# Patient Record
Sex: Female | Born: 1950 | Race: White | Hispanic: No | Marital: Married | Smoking: Never smoker
Health system: Southern US, Community
[De-identification: ages and names within clinical notes are randomized; demographics above are authoritative.]

## PROBLEM LIST (undated history)

## (undated) DIAGNOSIS — E7800 Pure hypercholesterolemia, unspecified: Secondary | ICD-10-CM

## (undated) DIAGNOSIS — K219 Gastro-esophageal reflux disease without esophagitis: Secondary | ICD-10-CM

## (undated) DIAGNOSIS — G4730 Sleep apnea, unspecified: Secondary | ICD-10-CM

## (undated) DIAGNOSIS — E119 Type 2 diabetes mellitus without complications: Secondary | ICD-10-CM

## (undated) HISTORY — DX: Pure hypercholesterolemia, unspecified: E78.00

## (undated) HISTORY — DX: Gastro-esophageal reflux disease without esophagitis: K21.9

## (undated) HISTORY — DX: Type 2 diabetes mellitus without complications: E11.9

## (undated) HISTORY — PX: DILATION AND CURETTAGE OF UTERUS: SHX78

## (undated) HISTORY — PX: CARPAL TUNNEL RELEASE: SHX101

## (undated) HISTORY — PX: ROTATOR CUFF REPAIR: SHX139

---

## 2002-01-21 ENCOUNTER — Ambulatory Visit (HOSPITAL_BASED_OUTPATIENT_CLINIC_OR_DEPARTMENT_OTHER): Admission: RE | Admit: 2002-01-21 | Discharge: 2002-01-21 | Payer: Self-pay | Admitting: Orthopedic Surgery

## 2004-03-29 ENCOUNTER — Encounter: Admission: RE | Admit: 2004-03-29 | Discharge: 2004-03-29 | Payer: Self-pay | Admitting: Unknown Physician Specialty

## 2005-04-12 ENCOUNTER — Encounter: Admission: RE | Admit: 2005-04-12 | Discharge: 2005-04-12 | Payer: Self-pay | Admitting: Unknown Physician Specialty

## 2005-07-31 ENCOUNTER — Emergency Department: Payer: Self-pay | Admitting: Unknown Physician Specialty

## 2005-07-31 ENCOUNTER — Other Ambulatory Visit: Payer: Self-pay

## 2005-12-06 ENCOUNTER — Ambulatory Visit: Payer: Self-pay

## 2006-04-03 ENCOUNTER — Ambulatory Visit: Payer: Self-pay | Admitting: Internal Medicine

## 2006-04-17 ENCOUNTER — Ambulatory Visit: Payer: Self-pay | Admitting: Internal Medicine

## 2006-04-17 ENCOUNTER — Encounter: Admission: RE | Admit: 2006-04-17 | Discharge: 2006-04-17 | Payer: Self-pay | Admitting: Unknown Physician Specialty

## 2006-05-15 ENCOUNTER — Ambulatory Visit: Payer: Self-pay | Admitting: Gastroenterology

## 2006-05-28 ENCOUNTER — Ambulatory Visit (HOSPITAL_BASED_OUTPATIENT_CLINIC_OR_DEPARTMENT_OTHER): Admission: RE | Admit: 2006-05-28 | Discharge: 2006-05-28 | Payer: Self-pay | Admitting: Orthopedic Surgery

## 2006-07-08 ENCOUNTER — Ambulatory Visit: Payer: Self-pay | Admitting: Gastroenterology

## 2007-05-25 ENCOUNTER — Encounter: Admission: RE | Admit: 2007-05-25 | Discharge: 2007-05-25 | Payer: Self-pay | Admitting: Unknown Physician Specialty

## 2007-10-06 ENCOUNTER — Ambulatory Visit: Payer: Self-pay

## 2007-10-08 ENCOUNTER — Ambulatory Visit: Payer: Self-pay

## 2008-06-01 ENCOUNTER — Encounter: Admission: RE | Admit: 2008-06-01 | Discharge: 2008-06-01 | Payer: Self-pay | Admitting: Unknown Physician Specialty

## 2009-06-19 ENCOUNTER — Encounter: Admission: RE | Admit: 2009-06-19 | Discharge: 2009-06-19 | Payer: Self-pay | Admitting: Unknown Physician Specialty

## 2010-03-04 ENCOUNTER — Encounter: Payer: Self-pay | Admitting: Unknown Physician Specialty

## 2010-06-07 ENCOUNTER — Other Ambulatory Visit: Payer: Self-pay | Admitting: Unknown Physician Specialty

## 2010-06-07 DIAGNOSIS — Z1231 Encounter for screening mammogram for malignant neoplasm of breast: Secondary | ICD-10-CM

## 2010-06-22 ENCOUNTER — Ambulatory Visit: Payer: Self-pay

## 2010-06-29 NOTE — Op Note (Signed)
   NAME:  Stephanie Freeman, Stephanie Freeman                         ACCOUNT NO.:  [id number]   MEDICAL RECORD NO.:  [id number]                   PATIENT TYPE:  AMB   LOCATION:  DSC                                  FACILITY:  [facility]   PHYSICIAN:  Cindee Salt, M.D.                    DATE OF BIRTH:  Mar 26, 1950   DATE OF PROCEDURE:  01/21/2002  DATE OF DISCHARGE:                                 OPERATIVE REPORT   PREOPERATIVE DIAGNOSIS:  Carpal tunnel syndrome, left hand.   POSTOPERATIVE DIAGNOSIS:  Carpal tunnel syndrome, left hand.   OPERATION:  Decompression of left median nerve.   SURGEON:  Cindee Salt, M.D.   ASSISTANT:  Alfredo Bach, R.N.   ANESTHESIA:  Forearm-based IV regional.   HISTORY:  The patient is a 60 year old female with a history of carpal  tunnel syndrome -- EMG and nerve conductions positive -- which has not  responded to conservative treatment.   DESCRIPTION OF PROCEDURE:  The patient was brought to the operating room  where a forearm-based IV regional anesthetic was carried out without  difficulty.  She was prepped and draped using Betadine scrubbing solution,  left arm free, supine position.  The incision was made longitudinally in the  palm and carried down through subcutaneous tissue; bleeders were  electrocauterized.  Palmar fascia was split, superficial palmar arch  identified, the flexor tendons to the ring and little fingers identified.  To the ulnar side of the median nerve, the carpal retinaculum was incised  with sharp dissection.  A right-angle and Sewall retractor were placed  between skin and forearm fascia.  The fascia was released for approximately  3 cm proximal to the wrist crease under direct vision.  The canal was  explored; the tenosynovial tissue was thickened; no further lesions were  identified.  The wound was irrigated.  The skin was closed with interrupted  5-0 nylon sutures.  A sterile compressive dressing and splint were applied.  The patient tolerated  the procedure well and was taken to the recovery room  for observation in satisfactory condition.   She is discharged home to return to the [facility] in one  week on Vicodin and Keflex.                                               Cindee Salt, M.D.    GK/MEDQ  D:  01/21/2002  T:  01/21/2002  Job:  [id number]

## 2010-06-29 NOTE — Op Note (Signed)
Stephanie Freeman, Stephanie Freeman               ACCOUNT NO.:  [id number]   MEDICAL RECORD NO.:  [id number]          PATIENT TYPE:  AMB   LOCATION:  DSC                          FACILITY:  [facility]   PHYSICIAN:  Mila Homer. Sherlean Foot, M.D. DATE OF BIRTH:  07/07/1950   DATE OF PROCEDURE:  05/28/2006  DATE OF DISCHARGE:                               OPERATIVE REPORT   SURGEON:  Mila Homer. Sherlean Foot, M.D.   ASSISTANT:  None.   ANESTHESIA:  General.   PREOPERATIVE DIAGNOSIS:  Right shoulder impingement syndrome.   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   PROCEDURE:  Right shoulder arthroscopy, subacromial decompression,  distal clavicle resection, mini open rotator cuff repair.   DESCRIPTION OF PROCEDURE:  The patient was laid supine, administered  general anesthesia and placed in the beach chair position.  The right  shoulder was then prepped and draped in the usual sterile fashion.  Anterior, posterior direct lateral portals were created with a #11  blade, blunt trocar and cannula.  Diagnostic arthroscopy at the  glenohumeral joint revealed some partial thickness tearing in the  rotator cuff interval, otherwise, the glenohumeral joint was negative.  I did put a 3.2 gator shaver in the anterior portal and debrided.  It  was a significant partial thickness tear or at least appears from the  glenohumeral joint.  I then went from the posterior portal into the  subacromial space and performed a subacromial decompression with the 4.0  mm cylindrical bur through the direct lateral port.  I also performed a  CA ligament release with the ArthroCare debridement wand.  I did not  feel any AC joint violation was necessary.  After debriding the rotator  cuff after I completed my decompression, I found a full thickness tear  in the area where the rotator cuff interval was and where I had  performed a debridement from the glenohumeral side.  Therefore, I  converted to a  mini open technique, extending the direct lateral portal  up 2 cm and put an Arthrex shoulder retractor in place.  We then  debrided the area to a bleeding bed and placed one 5.5 Bio cork screw  from Arthrex with a modified Mason-Allen suture to repair.  I then  lavaged closed the deltoid interval with a 0 Vicryl interrupted stitch  and the subcuticular with 2-0 Vicryls and then Steri-Strips.  I closed  the portals with 4-0 nylons, dressed with Xeroform dressing sponges and  a 2 inch silk tape and a simple sling.   COMPLICATIONS:  None.   DRAINS:  None.           ______________________________  Mila Homer. Sherlean Foot, M.D.    SDL/MEDQ  D:  05/28/2006  T:  05/28/2006  Job:  [id number]

## 2010-07-02 ENCOUNTER — Ambulatory Visit
Admission: RE | Admit: 2010-07-02 | Discharge: 2010-07-02 | Disposition: A | Discharge status: Home / Self Care | Payer: Commercial Indemnity | Source: Ambulatory Visit | Attending: Unknown Physician Specialty | Admitting: Unknown Physician Specialty

## 2010-07-02 DIAGNOSIS — Z1231 Encounter for screening mammogram for malignant neoplasm of breast: Secondary | ICD-10-CM

## 2010-11-29 ENCOUNTER — Other Ambulatory Visit: Payer: Self-pay | Admitting: Rheumatology

## 2011-06-24 ENCOUNTER — Other Ambulatory Visit: Payer: Self-pay | Admitting: Unknown Physician Specialty

## 2011-06-24 DIAGNOSIS — Z1231 Encounter for screening mammogram for malignant neoplasm of breast: Secondary | ICD-10-CM

## 2011-07-09 ENCOUNTER — Ambulatory Visit: Payer: Commercial Indemnity

## 2011-07-15 ENCOUNTER — Ambulatory Visit
Admission: RE | Admit: 2011-07-15 | Discharge: 2011-07-15 | Disposition: A | Discharge status: Home / Self Care | Payer: Commercial Indemnity | Source: Ambulatory Visit | Attending: Unknown Physician Specialty | Admitting: Unknown Physician Specialty

## 2011-07-15 DIAGNOSIS — Z1231 Encounter for screening mammogram for malignant neoplasm of breast: Secondary | ICD-10-CM

## 2011-11-28 ENCOUNTER — Ambulatory Visit: Payer: Self-pay | Admitting: Gastroenterology

## 2011-12-02 LAB — PATHOLOGY REPORT

## 2012-06-23 ENCOUNTER — Other Ambulatory Visit: Payer: Self-pay

## 2012-06-23 DIAGNOSIS — Z1231 Encounter for screening mammogram for malignant neoplasm of breast: Secondary | ICD-10-CM

## 2012-07-24 ENCOUNTER — Ambulatory Visit: Payer: Commercial Indemnity

## 2012-08-03 ENCOUNTER — Ambulatory Visit
Admission: RE | Admit: 2012-08-03 | Discharge: 2012-08-03 | Disposition: A | Discharge status: Home / Self Care | Payer: Managed Care, Other (non HMO) | Source: Ambulatory Visit

## 2012-08-03 DIAGNOSIS — Z1231 Encounter for screening mammogram for malignant neoplasm of breast: Secondary | ICD-10-CM

## 2013-07-07 ENCOUNTER — Other Ambulatory Visit: Payer: Self-pay

## 2013-07-07 DIAGNOSIS — Z1231 Encounter for screening mammogram for malignant neoplasm of breast: Secondary | ICD-10-CM

## 2013-08-04 ENCOUNTER — Ambulatory Visit: Payer: Managed Care, Other (non HMO)

## 2013-08-18 ENCOUNTER — Ambulatory Visit
Admission: RE | Admit: 2013-08-18 | Discharge: 2013-08-18 | Disposition: A | Discharge status: Home / Self Care | Payer: Managed Care, Other (non HMO) | Source: Ambulatory Visit

## 2013-08-18 ENCOUNTER — Encounter (INDEPENDENT_AMBULATORY_CARE_PROVIDER_SITE_OTHER): Payer: Self-pay

## 2013-08-18 DIAGNOSIS — Z1231 Encounter for screening mammogram for malignant neoplasm of breast: Secondary | ICD-10-CM

## 2014-03-15 DIAGNOSIS — G2581 Restless legs syndrome: Secondary | ICD-10-CM | POA: Insufficient documentation

## 2014-07-07 ENCOUNTER — Encounter: Payer: Managed Care, Other (non HMO) | Attending: Internal Medicine | Admitting: Dietician

## 2014-07-07 ENCOUNTER — Encounter: Payer: Self-pay | Admitting: Dietician

## 2014-07-07 VITALS — Ht 63.25 in | Wt 194.2 lb

## 2014-07-07 DIAGNOSIS — K219 Gastro-esophageal reflux disease without esophagitis: Secondary | ICD-10-CM | POA: Insufficient documentation

## 2014-07-07 DIAGNOSIS — G4733 Obstructive sleep apnea (adult) (pediatric): Secondary | ICD-10-CM | POA: Insufficient documentation

## 2014-07-07 DIAGNOSIS — Z9989 Dependence on other enabling machines and devices: Secondary | ICD-10-CM

## 2014-07-07 DIAGNOSIS — Z86010 Personal history of colonic polyps: Secondary | ICD-10-CM | POA: Insufficient documentation

## 2014-07-07 DIAGNOSIS — E785 Hyperlipidemia, unspecified: Secondary | ICD-10-CM | POA: Insufficient documentation

## 2014-07-07 DIAGNOSIS — R739 Hyperglycemia, unspecified: Secondary | ICD-10-CM | POA: Diagnosis not present

## 2014-07-07 DIAGNOSIS — B9681 Helicobacter pylori [H. pylori] as the cause of diseases classified elsewhere: Secondary | ICD-10-CM | POA: Insufficient documentation

## 2014-07-07 DIAGNOSIS — K2970 Gastritis, unspecified, without bleeding: Secondary | ICD-10-CM

## 2014-07-07 DIAGNOSIS — D690 Allergic purpura: Secondary | ICD-10-CM | POA: Insufficient documentation

## 2014-07-07 NOTE — Progress Notes (Signed)
Medical Nutrition Therapy: Visit start time: 1330  end time: 1430  Assessment:  Diagnosis: hyperglycemia Past medical history: GERD, hyperlipidemia, diverticulosis, hypoglycemia, sleep apnea Psychosocial issues/ stress concerns: none Preferred learning method:  . Hands-on  Current weight: 194.9lbs  Height: 5'3" Medications, supplements: updated list in chart Progress and evaluation: Patient reports recent elevated BG results and is trying to prevent or delay diagnosis of Diabetes.           She reports her husband has Type 2 Diabetes and she has worked with him to make diet changes to reduce carbohydrate intake and increase vegetables and fruits.          Patient reports recent weight loss of 6lbs since making diet changes.  Physical activity: climbs stairs to work, other ADLs  Dietary Intake:  Usual eating pattern includes 2 meals and 0 snacks per day. Dining out frequency: 3-4 meals per week.  Breakfast: [place] yogurt with almonds, granola Snack: usually none, sometimes piece of candy Lunch: salad with avocado, almonds, sm amount dressing Snack: none Supper: 07/06/14 steak sub with tomato/lettuce, peppers onions; chicken and vegetables; hot dog Snack: usually none Beverages: water, coffee, tea sometimes with sugar  Nutrition Care Education: Topics covered: Diabetes prevention Basic nutrition: basic food groups, appropriate nutrient balance, appropriate meal and snack schedule, general nutrition guidelines    Weight control: benefits of weight control; Provided guidance for 1300kcal meal plan (2-3 carbs, 1-3oz protein, vegetables with each meal) Advanced nutrition:  food label reading Diabetes:  goals for BGs, appropriate meal and snack schedule, appropriate carb intake and balance Other lifestyle changes: importance of regular exercise, increasing motivation for exercise.   Nutritional Diagnosis:  NB-2.1 Physical inactivity As related to sedentary job.  As evidenced by elevated BG,  BMI 33.  Intervention: Instruction as noted above.   Patient seems to be making healthy food choices overall with recent diet changes.   Patient declined follow-up visit at this time; encouraged her to call as needed with any questions.     Education Materials given:  . General diet guidelines for Diabetes . Food lists/ Planning A Balanced Meal . Sample meal pattern/ menus: Quick and Healthy Meal Ideas . Goals/ instructions  Learner/ who was taught:  . Patient   Level of understanding: Marland Kitchen Verbalizes/ demonstrates competency  Demonstrated degree of understanding via:   Teach back Learning barriers: . None  Willingness to learn/ readiness for change: . Eager, change in progress  Monitoring and Evaluation:  Patient to call as needed.

## 2014-07-07 NOTE — Patient Instructions (Signed)
Keep carbohydrate choices to 2-3 servings, or 30-45g with each meal.  Begin some exercise on a regular basis; start with short time (i.e. 10 minutes) and increase as you are able.  Continue with healthy food choices.

## 2014-07-13 ENCOUNTER — Other Ambulatory Visit: Payer: Self-pay

## 2014-07-13 DIAGNOSIS — Z1231 Encounter for screening mammogram for malignant neoplasm of breast: Secondary | ICD-10-CM

## 2014-08-22 ENCOUNTER — Ambulatory Visit
Admission: RE | Admit: 2014-08-22 | Discharge: 2014-08-22 | Disposition: A | Discharge status: Home / Self Care | Payer: Managed Care, Other (non HMO) | Source: Ambulatory Visit

## 2014-08-22 DIAGNOSIS — Z1231 Encounter for screening mammogram for malignant neoplasm of breast: Secondary | ICD-10-CM

## 2014-09-21 DIAGNOSIS — E1165 Type 2 diabetes mellitus with hyperglycemia: Secondary | ICD-10-CM | POA: Insufficient documentation

## 2014-09-21 DIAGNOSIS — IMO0002 Reserved for concepts with insufficient information to code with codable children: Secondary | ICD-10-CM | POA: Insufficient documentation

## 2015-03-13 ENCOUNTER — Other Ambulatory Visit: Payer: Self-pay

## 2015-03-13 DIAGNOSIS — Z1231 Encounter for screening mammogram for malignant neoplasm of breast: Secondary | ICD-10-CM

## 2015-04-25 ENCOUNTER — Ambulatory Visit (INDEPENDENT_AMBULATORY_CARE_PROVIDER_SITE_OTHER): Payer: Managed Care, Other (non HMO)

## 2015-04-25 ENCOUNTER — Encounter: Payer: Self-pay | Admitting: Podiatry

## 2015-04-25 ENCOUNTER — Ambulatory Visit (INDEPENDENT_AMBULATORY_CARE_PROVIDER_SITE_OTHER): Payer: Managed Care, Other (non HMO) | Admitting: Podiatry

## 2015-04-25 VITALS — BP 141/78 | HR 76 | Resp 18

## 2015-04-25 DIAGNOSIS — L84 Corns and callosities: Secondary | ICD-10-CM | POA: Diagnosis not present

## 2015-04-25 DIAGNOSIS — M205X9 Other deformities of toe(s) (acquired), unspecified foot: Secondary | ICD-10-CM | POA: Diagnosis not present

## 2015-04-25 DIAGNOSIS — R52 Pain, unspecified: Secondary | ICD-10-CM | POA: Diagnosis not present

## 2015-04-25 DIAGNOSIS — M898X9 Other specified disorders of bone, unspecified site: Secondary | ICD-10-CM | POA: Diagnosis not present

## 2015-04-25 DIAGNOSIS — M205X1 Other deformities of toe(s) (acquired), right foot: Secondary | ICD-10-CM

## 2015-04-25 NOTE — Progress Notes (Signed)
   Subjective:    Patient ID: Stephanie Freeman, female    DOB: [birth year], 65 y.o.   MRN: [id number]  HPI  65 year old female presents the also concerns of pain in the right fourth toe which is been ongoing for possibly 3 months. No recent injury or trauma. No swelling or redness. She also occasionally gets some pain in the big toe joint however is not symptomatic at this time. No other complaints.    Review of Systems  All other systems reviewed and are negative.      Objective:   Physical Exam General: AAO x3, NAD  Dermatological: Hyperkeratotic lesion is present on the lateral aspect of the right fourth toe at the level of the PIPJ. Upon debridement no underlying ulceration, drainage or other signs of infection. No other open lesions or pre-ulcer lesions identified at this time.  Vascular: Dorsalis Pedis artery and Posterior Tibial artery pedal pulses are 2/4 bilateral with immedate capillary fill time. Pedal hair growth present. No varicosities and no lower extremity edema present bilateral. There is no pain with calf compression, swelling, warmth, erythema.   Neruologic: Grossly intact via light touch bilateral. Vibratory intact via tuning fork bilateral. Protective threshold with Semmes Wienstein monofilament intact to all pedal sites bilateral. Patellar and Achilles deep tendon reflexes 2+ bilateral. No Babinski or clonus noted bilateral.   Musculoskeletal: Hallux limitus is present on the right side is a palpable dorsal medial spur of the first metatarsal head. 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 There is mild tenderness along the hyperkeratotic lesion right fourth toe. No other open lesions or pre-ulcerative lesions are identified this time. MMT 5/5.  Gait: Unassisted, Nonantalgic.      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 Hammertoe contractures and I threaded changes in the first MTPJ. No evidence of acute fracture or stress fracture. -Hyperkeratotic lesion was debrided without complications or bleeding. Offloading pads were dispensed. -Discussed shoe gear modifications and orthotics for her foot type however she wishes to on custom orthotics at this time. -Discussed surgical intervention in the future if symptoms persist.  Celesta Gentile, DPM

## 2015-08-24 ENCOUNTER — Other Ambulatory Visit: Payer: Self-pay | Admitting: Obstetrics & Gynecology

## 2015-08-24 ENCOUNTER — Ambulatory Visit
Admission: RE | Admit: 2015-08-24 | Discharge: 2015-08-24 | Disposition: A | Discharge status: Home / Self Care | Payer: Medicare Other | Source: Ambulatory Visit

## 2015-08-24 DIAGNOSIS — Z1231 Encounter for screening mammogram for malignant neoplasm of breast: Secondary | ICD-10-CM

## 2015-12-28 ENCOUNTER — Ambulatory Visit: Payer: Medicare Other | Attending: Specialist

## 2015-12-28 DIAGNOSIS — G4733 Obstructive sleep apnea (adult) (pediatric): Secondary | ICD-10-CM | POA: Insufficient documentation

## 2016-07-10 ENCOUNTER — Other Ambulatory Visit: Payer: Self-pay | Admitting: Internal Medicine

## 2016-07-10 DIAGNOSIS — Z1231 Encounter for screening mammogram for malignant neoplasm of breast: Secondary | ICD-10-CM

## 2016-08-02 ENCOUNTER — Ambulatory Visit (INDEPENDENT_AMBULATORY_CARE_PROVIDER_SITE_OTHER): Payer: Medicare Other | Admitting: Advanced Practice Midwife

## 2016-08-02 ENCOUNTER — Encounter: Payer: Self-pay | Admitting: Advanced Practice Midwife

## 2016-08-02 VITALS — BP 140/60 | HR 74 | Ht 63.0 in | Wt 194.0 lb

## 2016-08-02 DIAGNOSIS — Z124 Encounter for screening for malignant neoplasm of cervix: Secondary | ICD-10-CM | POA: Diagnosis not present

## 2016-08-02 DIAGNOSIS — Z01419 Encounter for gynecological examination (general) (routine) without abnormal findings: Secondary | ICD-10-CM

## 2016-08-02 NOTE — Progress Notes (Signed)
Patient ID: Chimere Klingensmith, female   DOB: 10-Dec-1950, 66 y.o.   MRN: [id number]     Gynecology Annual Exam  PCP: Madelyn Brunner, MD  Chief Complaint:  Chief Complaint  Patient presents with  . Gynecologic Exam    History of Present Illness:Patient is a 66 y.o. G2P1102 presents for annual exam. The patient has no complaints today. She does have a significant family history of breast cancer- her mother diagnosed in her 79's but the patient is undecided on genetic testing at this time.   LMP: No LMP recorded. Patient is postmenopausal. Menarche:not applicable   The patient is not sexually active. She denies dyspareunia.  The patient does perform self breast exams.  There is notable family history of breast or ovarian cancer in her family.  The patient wears seatbelts: yes.   The patient has regular exercise: yes.  The patient admits healthy diet and adequate hydration. She admits good support from friends and family.  The patient denies current symptoms of depression.     Review of Systems: Review of Systems  Constitutional: Negative.   HENT: Negative.   Eyes: Negative.   Respiratory: Negative.   Cardiovascular: Negative.   Gastrointestinal: Negative.   Genitourinary: Negative.   Musculoskeletal: Negative.   Skin: Negative.   Neurological: Negative.   Endo/Heme/Allergies: Negative.   Psychiatric/Behavioral: Negative.     Past Medical History:  Past Medical History:  Diagnosis Date  . Diabetes mellitus without complication ([place])   . GERD (gastroesophageal reflux disease)   . Hypercholesterolemia     Past Surgical History:  Past Surgical History:  Procedure Laterality Date  . CARPAL TUNNEL RELEASE     LEFT  . CESAREAN SECTION     X2  . DILATION AND CURETTAGE OF UTERUS     LAP  . ROTATOR CUFF REPAIR      Gynecologic History:  No LMP recorded. Patient is postmenopausal. Last Pap: Results were: normal 1 year ago  Last mammogram: 1 year ago Results were:  normal Obstetric History: [id number]  Family History:  Family History  Problem Relation Age of Onset  . Breast cancer Mother        60  . Cancer Maternal Aunt        COLON  . Cancer Paternal Uncle        BRAIN  . Cancer Maternal Grandmother        STOMACH  . Dementia Maternal Aunt   . Dementia Maternal Aunt     Social History:  Social History   Social History  . Marital status: Married    Spouse name: N/A  . Number of children: N/A  . Years of education: N/A   Occupational History  . Not on file.   Social History Main Topics  . Smoking status: Never Smoker  . Smokeless tobacco: Never Used  . Alcohol use No  . Drug use: No  . Sexual activity: No   Other Topics Concern  . Not on file   Social History Narrative  . No narrative on file    Allergies:  Allergies  Allergen Reactions  . Kiwi Extract Anaphylaxis    Medications: Prior to Admission medications   Medication Sig Start Date End Date Taking? Authorizing Provider  atorvastatin (LIPITOR) 10 MG tablet Take by mouth. 05/21/16  Yes [provider]  B Complex Vitamins (B-COMPLEX/B-12) TABS Take by mouth.   Yes [provider]  Blood Glucose Monitoring Suppl (RA BLOOD GLUCOSE MONITOR) DEVI  12/28/14  Yes [provider]  Calcium-Magnesium-Zinc 500-250-12.5 MG TABS Take 2 tablets by mouth 1 day or 1 dose.   Yes [provider]  Cholecalciferol (VITAMIN D3) 5000 units CAPS Take 1 capsule by mouth daily.   Yes [provider]  Omega 3-6-9 CAPS Take by mouth.   Yes [provider]  omeprazole (PRILOSEC) 40 MG capsule  05/20/14  Yes [provider]  rOPINIRole (REQUIP) 1 MG tablet  06/13/14  Yes [provider]  Omega 3-6-9 CAPS Take by mouth.    [provider]    Physical Exam Vitals: Blood pressure 140/60, pulse 74, height 5\' 3"  (1.6 m), weight 194 lb (88 kg).  General: NAD HEENT: normocephalic, anicteric Thyroid: no enlargement, no  palpable nodules Pulmonary: No increased work of breathing, CTAB Cardiovascular: RRR, distal pulses 2+ Breast: Breast symmetrical, no tenderness, no palpable nodules or masses, no skin or nipple retraction present, no nipple discharge.  No axillary or supraclavicular lymphadenopathy. Abdomen: NABS, soft, non-tender, non-distended.  Umbilicus without lesions.  No hepatomegaly, splenomegaly or masses palpable. No evidence of hernia  Genitourinary:  External: Normal external female genitalia.  Normal urethral meatus, normal  Bartholin's and Skene's glands.    Vagina: Normal vaginal mucosa, no evidence of prolapse.    Cervix: Grossly normal in appearance, no bleeding, no CMT  Uterus: Non-enlarged, mobile, normal contour.    Adnexa: ovaries non-enlarged, no adnexal masses  Rectal: deferred  Lymphatic: no evidence of inguinal lymphadenopathy Extremities: no edema, erythema, or tenderness Neurologic: Grossly intact Psychiatric: mood appropriate, affect full    Assessment: 66 y.o. [id number] Well woman exam with PAP  Plan: Problem List Items Addressed This Visit    None    Visit Diagnoses    Well woman exam with routine gynecological exam    -  Primary   Relevant Orders   Pap IG (Image Guided)   Cervical cancer screening       Relevant Orders   Pap IG (Image Guided)      1) Mammogram - recommend yearly screening mammogram.  Mammogram scheduled for next month  2) Continue healthy lifestyle diet and exercise  3) ASCCP guidelines and rational discussed.  Patient opts for yearly screening interval  4) Osteoporosis  - per USPTF routine screening DEXA at age 42 Up to date per PCP  5) Routine healthcare maintenance including cholesterol, diabetes screening discussed managed by PCP  6) Colonoscopy up to date per PCP  7) Follow up 1 year for routine annual  Rod Can, [place]

## 2016-08-05 LAB — PAP IG (IMAGE GUIDED): PAP Smear Comment: 0

## 2016-09-02 ENCOUNTER — Ambulatory Visit
Admission: RE | Admit: 2016-09-02 | Discharge: 2016-09-02 | Disposition: A | Discharge status: Home / Self Care | Payer: Medicare Other | Source: Ambulatory Visit | Attending: Internal Medicine | Admitting: Internal Medicine

## 2016-09-02 ENCOUNTER — Other Ambulatory Visit: Payer: Self-pay | Admitting: Advanced Practice Midwife

## 2016-09-02 DIAGNOSIS — Z1231 Encounter for screening mammogram for malignant neoplasm of breast: Secondary | ICD-10-CM

## 2016-09-19 ENCOUNTER — Encounter: Payer: Self-pay | Admitting: Advanced Practice Midwife

## 2017-07-03 ENCOUNTER — Other Ambulatory Visit: Payer: Self-pay | Admitting: Internal Medicine

## 2017-07-03 DIAGNOSIS — Z1231 Encounter for screening mammogram for malignant neoplasm of breast: Secondary | ICD-10-CM

## 2017-08-01 ENCOUNTER — Ambulatory Visit: Admit: 2017-08-01 | Payer: Medicare Other | Admitting: Unknown Physician Specialty

## 2017-08-01 SURGERY — COLONOSCOPY WITH PROPOFOL
Anesthesia: General

## 2017-09-03 ENCOUNTER — Ambulatory Visit
Admission: RE | Admit: 2017-09-03 | Discharge: 2017-09-03 | Disposition: A | Discharge status: Home / Self Care | Payer: Medicare Other | Source: Ambulatory Visit | Attending: Internal Medicine | Admitting: Internal Medicine

## 2017-09-03 DIAGNOSIS — Z1231 Encounter for screening mammogram for malignant neoplasm of breast: Secondary | ICD-10-CM

## 2017-11-05 ENCOUNTER — Encounter: Payer: Self-pay | Admitting: *Deleted

## 2017-11-05 ENCOUNTER — Ambulatory Visit: Payer: Medicare Other | Admitting: Certified Registered Nurse Anesthetist

## 2017-11-05 ENCOUNTER — Encounter
Admission: RE | Disposition: A | Discharge status: Home / Self Care | Payer: Self-pay | Source: Ambulatory Visit | Attending: Unknown Physician Specialty

## 2017-11-05 ENCOUNTER — Ambulatory Visit
Admission: RE | Admit: 2017-11-05 | Discharge: 2017-11-05 | Disposition: A | Discharge status: Home / Self Care | Payer: Medicare Other | Source: Ambulatory Visit | Attending: Unknown Physician Specialty | Admitting: Unknown Physician Specialty

## 2017-11-05 DIAGNOSIS — Z91018 Allergy to other foods: Secondary | ICD-10-CM | POA: Diagnosis not present

## 2017-11-05 DIAGNOSIS — Z79899 Other long term (current) drug therapy: Secondary | ICD-10-CM | POA: Diagnosis not present

## 2017-11-05 DIAGNOSIS — Z803 Family history of malignant neoplasm of breast: Secondary | ICD-10-CM | POA: Diagnosis not present

## 2017-11-05 DIAGNOSIS — E7800 Pure hypercholesterolemia, unspecified: Secondary | ICD-10-CM | POA: Diagnosis not present

## 2017-11-05 DIAGNOSIS — Z800 Family history of malignant neoplasm of digestive organs: Secondary | ICD-10-CM | POA: Diagnosis not present

## 2017-11-05 DIAGNOSIS — D123 Benign neoplasm of transverse colon: Secondary | ICD-10-CM | POA: Insufficient documentation

## 2017-11-05 DIAGNOSIS — Z820 Family history of epilepsy and other diseases of the nervous system: Secondary | ICD-10-CM | POA: Insufficient documentation

## 2017-11-05 DIAGNOSIS — K5730 Diverticulosis of large intestine without perforation or abscess without bleeding: Secondary | ICD-10-CM | POA: Diagnosis not present

## 2017-11-05 DIAGNOSIS — Z86010 Personal history of colonic polyps: Secondary | ICD-10-CM | POA: Diagnosis not present

## 2017-11-05 DIAGNOSIS — E119 Type 2 diabetes mellitus without complications: Secondary | ICD-10-CM | POA: Diagnosis not present

## 2017-11-05 DIAGNOSIS — G4730 Sleep apnea, unspecified: Secondary | ICD-10-CM | POA: Insufficient documentation

## 2017-11-05 DIAGNOSIS — Z7984 Long term (current) use of oral hypoglycemic drugs: Secondary | ICD-10-CM | POA: Diagnosis not present

## 2017-11-05 DIAGNOSIS — Z1211 Encounter for screening for malignant neoplasm of colon: Secondary | ICD-10-CM | POA: Diagnosis present

## 2017-11-05 DIAGNOSIS — K640 First degree hemorrhoids: Secondary | ICD-10-CM | POA: Insufficient documentation

## 2017-11-05 DIAGNOSIS — K219 Gastro-esophageal reflux disease without esophagitis: Secondary | ICD-10-CM | POA: Diagnosis not present

## 2017-11-05 HISTORY — PX: COLONOSCOPY WITH PROPOFOL: SHX5780

## 2017-11-05 HISTORY — DX: Sleep apnea, unspecified: G47.30

## 2017-11-05 LAB — GLUCOSE, CAPILLARY: Glucose-Capillary: 144 mg/dL — ABNORMAL HIGH (ref 70–99)

## 2017-11-05 SURGERY — COLONOSCOPY WITH PROPOFOL
Anesthesia: General

## 2017-11-05 MED ORDER — PROPOFOL 10 MG/ML IV BOLUS
INTRAVENOUS | Status: DC | PRN
  Administered 2017-11-05: 40 mg via INTRAVENOUS
  Administered 2017-11-05: 60 mg via INTRAVENOUS
  Administered 2017-11-05: 30 mg via INTRAVENOUS

## 2017-11-05 MED ORDER — SODIUM CHLORIDE 0.9 % IV SOLN: INTRAVENOUS | Status: DC

## 2017-11-05 MED ORDER — PROPOFOL 500 MG/50ML IV EMUL
INTRAVENOUS | Status: AC
  Filled 2017-11-05: qty 50

## 2017-11-05 MED ORDER — LIDOCAINE HCL (CARDIAC) PF 100 MG/5ML IV SOSY
PREFILLED_SYRINGE | INTRAVENOUS | Status: DC | PRN
  Administered 2017-11-05: 50 mg via INTRAVENOUS

## 2017-11-05 MED ORDER — LIDOCAINE HCL (PF) 2 % IJ SOLN
INTRAMUSCULAR | Status: AC
  Filled 2017-11-05: qty 10

## 2017-11-05 MED ORDER — SODIUM CHLORIDE 0.9 % IV SOLN
INTRAVENOUS | Status: DC
  Administered 2017-11-05: 1000 mL via INTRAVENOUS

## 2017-11-05 MED ORDER — PROPOFOL 500 MG/50ML IV EMUL
INTRAVENOUS | Status: DC | PRN
  Administered 2017-11-05: 175 ug/kg/min via INTRAVENOUS

## 2017-11-05 NOTE — Op Note (Signed)
[facility] Patient Name: Stephanie Freeman Procedure Date: 11/05/2017 7:24 AM MRN: [id number] Account #: [id number] Date of Birth: [birth year] Admit Type: Outpatient Age: 67 Room: [facility] ENDO ROOM 3 Gender: Female Note Status: Finalized Procedure:            Colonoscopy Indications:          High risk colon cancer surveillance: Personal history                        of colonic polyps Providers:            Manya Silvas, MD Referring MD:         Biagio Borg, MD (Referring MD) Medicines:            Propofol per Anesthesia Complications:        No immediate complications. Procedure:            Pre-Anesthesia Assessment:                       - After reviewing the risks and benefits, the patient                        was deemed in satisfactory condition to undergo the                        procedure.                       After obtaining informed consent, the colonoscope was                        passed under direct vision. Throughout the procedure,                        the patient's blood pressure, pulse, and oxygen                        saturations were monitored continuously. The                        Colonoscope was introduced through the anus and                        advanced to the the cecum, identified by appendiceal                        orifice and ileocecal valve. The colonoscopy was                        performed without difficulty. The patient tolerated the                        procedure well. The quality of the bowel preparation                        was excellent. Findings:      A small polyp was found in the transverse colon. The polyp was sessile.       The polyp was removed with a cold snare. Resection and retrieval were       complete.      Many small-mouthed diverticula were found  in the sigmoid colon and       descending colon.      Internal hemorrhoids were found during endoscopy. The hemorrhoids were   small and Grade I (internal hemorrhoids that do not prolapse).      The exam was otherwise without abnormality. Impression:           - One small polyp in the transverse colon, removed with                        a cold snare. Resected and retrieved.                       - Diverticulosis in the sigmoid colon and in the                        descending colon.                       - Internal hemorrhoids.                       - The examination was otherwise normal. Recommendation:       - Await pathology results. Manya Silvas, MD 11/05/2017 8:03:20 AM This report has been signed electronically. Number of Addenda: 0 Note Initiated On: 11/05/2017 7:24 AM Scope Withdrawal Time: 0 hours 11 minutes 18 seconds       [facility]

## 2017-11-05 NOTE — Anesthesia Post-op Follow-up Note (Signed)
Anesthesia QCDR form completed.        

## 2017-11-05 NOTE — Transfer of Care (Signed)
Immediate Anesthesia Transfer of Care Note  Patient: Rishika Mccollom  Procedure(s) Performed: COLONOSCOPY WITH PROPOFOL (N/A )  Patient Location: PACU  Anesthesia Type:General  Level of Consciousness: sedated  Airway & Oxygen Therapy: Patient Spontanous Breathing and Patient connected to nasal cannula oxygen  Post-op Assessment: Report given to RN and Post -op Vital signs reviewed and stable  Post vital signs: Reviewed and stable  Last Vitals:  Vitals Value Taken Time  BP 87/35 11/05/2017  8:02 AM  Temp 35.9 C 11/05/2017  8:00 AM  Pulse 66 11/05/2017  8:03 AM  Resp 16 11/05/2017  8:03 AM  SpO2 99 % 11/05/2017  8:03 AM  Vitals shown include unvalidated device data.  Last Pain:  Vitals:   11/05/17 0800  TempSrc: Tympanic  PainSc:          Complications: No apparent anesthesia complications

## 2017-11-05 NOTE — H&P (Signed)
Primary Care Physician:  Baxter Hire, MD Primary Gastroenterologist:  Dr. Vira Agar  Pre-Procedure History & Physical: HPI:  Stephanie Freeman is a 67 y.o. female is here for an colonoscopy.  Done for [facility] colon polyps.   Past Medical History:  Diagnosis Date  . Diabetes mellitus without complication ([place])   . GERD (gastroesophageal reflux disease)   . Hypercholesterolemia   . Sleep apnea     Past Surgical History:  Procedure Laterality Date  . CARPAL TUNNEL RELEASE     LEFT  . CESAREAN SECTION     X2  . DILATION AND CURETTAGE OF UTERUS     LAP  . ROTATOR CUFF REPAIR      Prior to Admission medications   Medication Sig Start Date End Date Taking? Authorizing Provider  glipiZIDE (GLUCOTROL) 5 MG tablet Take by mouth daily before breakfast.   Yes [provider]  metFORMIN (GLUCOPHAGE) 500 MG tablet Take by mouth 2 (two) times daily with a meal.   Yes [provider]  atorvastatin (LIPITOR) 10 MG tablet Take by mouth. 05/21/16   [provider]  B Complex Vitamins (B-COMPLEX/B-12) TABS Take by mouth.    [provider]  Blood Glucose Monitoring Suppl (RA BLOOD GLUCOSE MONITOR) DEVI  12/28/14   [provider]  Calcium-Magnesium-Zinc 500-250-12.5 MG TABS Take 2 tablets by mouth 1 day or 1 dose.    [provider]  Cholecalciferol (VITAMIN D3) 5000 units CAPS Take 1 capsule by mouth daily.    [provider]  Omega 3-6-9 CAPS Take by mouth.    [provider]  Omega 3-6-9 CAPS Take by mouth.    [provider]  omeprazole (PRILOSEC) 40 MG capsule  05/20/14   [provider]  rOPINIRole (REQUIP) 1 MG tablet  06/13/14   [provider]    Allergies as of 08/28/2017 - Review Complete 08/02/2016  Allergen Reaction Noted  . Kiwi extract Anaphylaxis 07/07/2014    Family History  Problem Relation Age of Onset  . Breast cancer Mother        17  . Cancer Maternal Aunt        COLON  .  Cancer Paternal Uncle        BRAIN  . Cancer Maternal Grandmother        STOMACH  . Dementia Maternal Aunt   . Dementia Maternal Aunt     Social History   Socioeconomic History  . Marital status: Married    Spouse name: Not on file  . Number of children: Not on file  . Years of education: Not on file  . Highest education level: Not on file  Occupational History  . Not on file  Social Needs  . Financial resource strain: Not on file  . Food insecurity:    Worry: Not on file    Inability: Not on file  . Transportation needs:    Medical: Not on file    Non-medical: Not on file  Tobacco Use  . Smoking status: Never Smoker  . Smokeless tobacco: Never Used  Substance and Sexual Activity  . Alcohol use: No    Alcohol/week: 0.0 standard drinks  . Drug use: No  . Sexual activity: Never    Birth control/protection: Post-menopausal  Lifestyle  . Physical activity:    Days per week: Not on file    Minutes per session: Not on file  . Stress: Not on file  Relationships  . Social connections:  Talks on phone: Not on file    Gets together: Not on file    Attends religious service: Not on file    Active member of club or organization: Not on file    Attends meetings of clubs or organizations: Not on file    Relationship status: Not on file  . Intimate partner violence:    Fear of current or ex partner: Not on file    Emotionally abused: Not on file    Physically abused: Not on file    Forced sexual activity: Not on file  Other Topics Concern  . Not on file  Social History Narrative  . Not on file    Review of Systems: See HPI, otherwise negative ROS  Physical Exam: BP (!) 144/74   Pulse 63   Temp 98.2 F (36.8 C) (Tympanic)   Resp 18   Ht 5\' 3"  (1.6 m)   Wt 84.8 kg   SpO2 98%   BMI 33.13 kg/m  General:   Alert,  pleasant and cooperative in NAD Head:  Normocephalic and atraumatic. Neck:  Supple; no masses or thyromegaly. Lungs:  Clear throughout to  auscultation.    Heart:  Regular rate and rhythm. Abdomen:  Soft, nontender and nondistended. Normal bowel sounds, without guarding, and without rebound.   Neurologic:  Alert and  oriented x4;  grossly normal neurologically.  Impression/Plan: Stephanie Freeman is here for an colonoscopy to be performed for Personal history of colon polyps.  Risks, benefits, limitations, and alternatives regarding  colonoscopy have been reviewed with the patient.  Questions have been answered.  All parties agreeable.   Gaylyn Cheers, MD  11/05/2017, 7:30 AM

## 2017-11-05 NOTE — Anesthesia Procedure Notes (Signed)
Date/Time: 11/05/2017 7:35 AM Performed by: Johnna Acosta, CRNA Pre-anesthesia Checklist: Patient identified, Emergency Drugs available, Suction available, Patient being monitored and Timeout performed Patient Re-evaluated:Patient Re-evaluated prior to induction Oxygen Delivery Method: Nasal cannula Preoxygenation: Pre-oxygenation with 100% oxygen Induction Type: IV induction

## 2017-11-05 NOTE — Anesthesia Preprocedure Evaluation (Signed)
Anesthesia Evaluation  Patient identified by MRN, date of birth, ID band Patient awake    Reviewed: Allergy & Precautions, H&P , NPO status , Patient's Chart, lab work & pertinent test results, reviewed documented beta blocker date and time   Airway Mallampati: II  TM Distance: >3 FB Neck ROM: full    Dental  (+) Dental Advidsory Given, Caps, Missing   Pulmonary neg shortness of breath, sleep apnea , neg COPD, neg recent URI,           Cardiovascular Exercise Tolerance: Good negative cardio ROS       Neuro/Psych negative neurological ROS  negative psych ROS   GI/Hepatic Neg liver ROS, GERD  ,  Endo/Other  diabetes, Well Controlled, Oral Hypoglycemic Agents  Renal/GU negative Renal ROS  negative genitourinary   Musculoskeletal   Abdominal   Peds  Hematology negative hematology ROS (+)   Anesthesia Other Findings Past Medical History: No date: Diabetes mellitus without complication (HCC) No date: GERD (gastroesophageal reflux disease) No date: Hypercholesterolemia No date: Sleep apnea   Reproductive/Obstetrics negative OB ROS                             Anesthesia Physical Anesthesia Plan  ASA: II  Anesthesia Plan: General   Post-op Pain Management:    Induction: Intravenous  PONV Risk Score and Plan: 3 and Propofol infusion and TIVA  Airway Management Planned: Nasal Cannula and Natural Airway  Additional Equipment:   Intra-op Plan:   Post-operative Plan:   Informed Consent: I have reviewed the patients History and Physical, chart, labs and discussed the procedure including the risks, benefits and alternatives for the proposed anesthesia with the patient or authorized representative who has indicated his/her understanding and acceptance.   Dental Advisory Given  Plan Discussed with: Anesthesiologist, CRNA and Surgeon  Anesthesia Plan Comments:         Anesthesia  Quick Evaluation

## 2017-11-06 ENCOUNTER — Encounter: Payer: Self-pay | Admitting: Unknown Physician Specialty

## 2017-11-06 LAB — SURGICAL PATHOLOGY

## 2017-11-06 NOTE — Anesthesia Postprocedure Evaluation (Signed)
Anesthesia Post Note  Patient: Stephanie Freeman  Procedure(s) Performed: COLONOSCOPY WITH PROPOFOL (N/A )  Patient location during evaluation: Endoscopy Anesthesia Type: General Level of consciousness: awake and alert Pain management: pain level controlled Vital Signs Assessment: post-procedure vital signs reviewed and stable Respiratory status: spontaneous breathing, nonlabored ventilation, respiratory function stable and patient connected to nasal cannula oxygen Cardiovascular status: blood pressure returned to baseline and stable Postop Assessment: no apparent nausea or vomiting Anesthetic complications: no     Last Vitals:  Vitals:   11/05/17 0820 11/05/17 0830  BP: (!) 112/58 121/66  Pulse: 61 63  Resp: (!) 22 18  Temp:    SpO2: 97% 98%    Last Pain:  Vitals:   11/05/17 0800  TempSrc: Tympanic  PainSc:                  Martha Clan

## 2017-11-27 ENCOUNTER — Other Ambulatory Visit: Payer: Self-pay | Admitting: Internal Medicine

## 2017-11-27 DIAGNOSIS — R945 Abnormal results of liver function studies: Principal | ICD-10-CM

## 2017-11-27 DIAGNOSIS — Z0000 Encounter for general adult medical examination without abnormal findings: Secondary | ICD-10-CM

## 2017-11-27 DIAGNOSIS — R7989 Other specified abnormal findings of blood chemistry: Secondary | ICD-10-CM

## 2017-12-03 ENCOUNTER — Ambulatory Visit
Admission: RE | Admit: 2017-12-03 | Discharge: 2017-12-03 | Disposition: A | Discharge status: Home / Self Care | Payer: Medicare Other | Source: Ambulatory Visit | Attending: Internal Medicine | Admitting: Internal Medicine

## 2017-12-03 DIAGNOSIS — R7989 Other specified abnormal findings of blood chemistry: Secondary | ICD-10-CM

## 2017-12-03 DIAGNOSIS — Z0000 Encounter for general adult medical examination without abnormal findings: Secondary | ICD-10-CM

## 2017-12-03 DIAGNOSIS — R945 Abnormal results of liver function studies: Principal | ICD-10-CM

## 2017-12-11 ENCOUNTER — Other Ambulatory Visit (HOSPITAL_COMMUNITY)
Admission: RE | Admit: 2017-12-11 | Discharge: 2017-12-11 | Disposition: A | Discharge status: Home / Self Care | Payer: Medicare Other | Source: Ambulatory Visit | Attending: Advanced Practice Midwife | Admitting: Advanced Practice Midwife

## 2017-12-11 ENCOUNTER — Ambulatory Visit (INDEPENDENT_AMBULATORY_CARE_PROVIDER_SITE_OTHER): Payer: Medicare Other | Admitting: Advanced Practice Midwife

## 2017-12-11 ENCOUNTER — Encounter: Payer: Self-pay | Admitting: Advanced Practice Midwife

## 2017-12-11 ENCOUNTER — Ambulatory Visit
Admission: RE | Admit: 2017-12-11 | Discharge: 2017-12-11 | Disposition: A | Discharge status: Home / Self Care | Payer: Medicare Other | Source: Ambulatory Visit | Attending: Internal Medicine | Admitting: Internal Medicine

## 2017-12-11 VITALS — BP 124/54 | HR 82 | Ht 63.0 in | Wt 198.0 lb

## 2017-12-11 DIAGNOSIS — R945 Abnormal results of liver function studies: Secondary | ICD-10-CM | POA: Insufficient documentation

## 2017-12-11 DIAGNOSIS — Z0000 Encounter for general adult medical examination without abnormal findings: Secondary | ICD-10-CM | POA: Diagnosis not present

## 2017-12-11 DIAGNOSIS — Z124 Encounter for screening for malignant neoplasm of cervix: Secondary | ICD-10-CM | POA: Diagnosis not present

## 2017-12-11 DIAGNOSIS — K760 Fatty (change of) liver, not elsewhere classified: Secondary | ICD-10-CM | POA: Insufficient documentation

## 2017-12-11 DIAGNOSIS — Z01419 Encounter for gynecological examination (general) (routine) without abnormal findings: Secondary | ICD-10-CM

## 2017-12-11 NOTE — Patient Instructions (Signed)
Preventive Care 65 Years and Older, Female Preventive care refers to lifestyle choices and visits with your health care provider that can promote health and wellness. What does preventive care include?  A yearly physical exam. This is also called an annual well check.  Dental exams once or twice a year.  Routine eye exams. Ask your health care provider how often you should have your eyes checked.  Personal lifestyle choices, including: ? Daily care of your teeth and gums. ? Regular physical activity. ? Eating a healthy diet. ? Avoiding tobacco and drug use. ? Limiting alcohol use. ? Practicing safe sex. ? Taking low-dose aspirin every day. ? Taking vitamin and mineral supplements as recommended by your health care provider. What happens during an annual well check? The services and screenings done by your health care provider during your annual well check will depend on your age, overall health, lifestyle risk factors, and family history of disease. Counseling Your health care provider may ask you questions about your:  Alcohol use.  Tobacco use.  Drug use.  Emotional well-being.  Home and relationship well-being.  Sexual activity.  Eating habits.  History of falls.  Memory and ability to understand (cognition).  Work and work environment.  Reproductive health.  Screening You may have the following tests or measurements:  Height, weight, and BMI.  Blood pressure.  Lipid and cholesterol levels. These may be checked every 5 years, or more frequently if you are over 50 years old.  Skin check.  Lung cancer screening. You may have this screening every year starting at age 55 if you have a 30-pack-year history of smoking and currently smoke or have quit within the past 15 years.  Fecal occult blood test (FOBT) of the stool. You may have this test every year starting at age 50.  Flexible sigmoidoscopy or colonoscopy. You may have a sigmoidoscopy every 5 years or  a colonoscopy every 10 years starting at age 50.  Hepatitis C blood test.  Hepatitis B blood test.  Sexually transmitted disease (STD) testing.  Diabetes screening. This is done by checking your blood sugar (glucose) after you have not eaten for a while (fasting). You may have this done every 1-3 years.  Bone density scan. This is done to screen for osteoporosis. You may have this done starting at age 65.  Mammogram. This may be done every 1-2 years. Talk to your health care provider about how often you should have regular mammograms.  Talk with your health care provider about your test results, treatment options, and if necessary, the need for more tests. Vaccines Your health care provider may recommend certain vaccines, such as:  Influenza vaccine. This is recommended every year.  Tetanus, diphtheria, and acellular pertussis (Tdap, Td) vaccine. You may need a Td booster every 10 years.  Varicella vaccine. You may need this if you have not been vaccinated.  Zoster vaccine. You may need this after age 60.  Measles, mumps, and rubella (MMR) vaccine. You may need at least one dose of MMR if you were born in 1957 or later. You may also need a second dose.  Pneumococcal 13-valent conjugate (PCV13) vaccine. One dose is recommended after age 65.  Pneumococcal polysaccharide (PPSV23) vaccine. One dose is recommended after age 65.  Meningococcal vaccine. You may need this if you have certain conditions.  Hepatitis A vaccine. You may need this if you have certain conditions or if you travel or work in places where you may be exposed to hepatitis   A.  Hepatitis B vaccine. You may need this if you have certain conditions or if you travel or work in places where you may be exposed to hepatitis B.  Haemophilus influenzae type b (Hib) vaccine. You may need this if you have certain conditions.  Talk to your health care provider about which screenings and vaccines you need and how often you  need them. This information is not intended to replace advice given to you by your health care provider. Make sure you discuss any questions you have with your health care provider. Document Released: 02/24/2015 Document Revised: 10/18/2015 Document Reviewed: 11/29/2014 Elsevier Interactive Patient Education  2018 Elsevier Inc.  

## 2017-12-11 NOTE — Progress Notes (Signed)
Patient ID: Stephanie Freeman, female   DOB: 20-Jan-1951, 67 y.o.   MRN: [id number]      Gynecology Annual Exam  PCP: Baxter Hire, MD  Chief Complaint:  Chief Complaint  Patient presents with  . Gynecologic Exam    History of Present Illness:Patient is a 67 y.o. G2P1102 presents for annual exam. The patient has no complaints today.   LMP: No LMP recorded. Patient is postmenopausal. Menarche:not applicable  Postcoital Bleeding: not applicable Dysmenorrhea: not applicable  The patient is not sexually active. She denies dyspareunia.  The patient does perform self breast exams.  There is notable family history of breast or ovarian cancer in her family. Her mother was diagnosed with breast cancer in her 64's. The patient has declined screening.   The patient wears seatbelts: yes.   The patient has regular exercise: she admits being active in the home taking care of her husband.    The patient denies current symptoms of depression.     Review of Systems: Review of Systems  Constitutional: Negative.   HENT: Negative.   Eyes: Negative.   Respiratory: Negative.   Cardiovascular: Negative.   Gastrointestinal:       Diarrhea   Genitourinary: Negative.   Musculoskeletal:       Joint pain   Skin: Negative.   Neurological:       Headaches   Endo/Heme/Allergies: Negative.   Psychiatric/Behavioral: Negative.     Past Medical History:  Past Medical History:  Diagnosis Date  . Diabetes mellitus without complication ([place])   . GERD (gastroesophageal reflux disease)   . Hypercholesterolemia   . Sleep apnea     Past Surgical History:  Past Surgical History:  Procedure Laterality Date  . CARPAL TUNNEL RELEASE     LEFT  . CESAREAN SECTION     X2  . COLONOSCOPY WITH PROPOFOL N/A 11/05/2017   Procedure: COLONOSCOPY WITH PROPOFOL;  Surgeon: Manya Silvas, MD;  [facility] END[facility]COPY;  Service: Endoscopy;  Laterality: N/A;  . DILATION AND CURETTAGE OF UTERUS     LAP  .  ROTATOR CUFF REPAIR      Gynecologic History:  No LMP recorded. Patient is postmenopausal. Last Pap: 1 year ago Results were:  no abnormalities  Last mammogram: 3 months ago Results were: BI-RAD I  Obstetric History: [id number]  Family History:  Family History  Problem Relation Age of Onset  . Breast cancer Mother        65  . Cancer Maternal Aunt        COLON  . Cancer Paternal Uncle        BRAIN  . Cancer Maternal Grandmother        STOMACH  . Dementia Maternal Aunt   . Dementia Maternal Aunt   . Heart disease Maternal Grandfather     Social History:  Social History   Socioeconomic History  . Marital status: Married    Spouse name: Not on file  . Number of children: Not on file  . Years of education: Not on file  . Highest education level: Not on file  Occupational History  . Not on file  Social Needs  . Financial resource strain: Not on file  . Food insecurity:    Worry: Not on file    Inability: Not on file  . Transportation needs:    Medical: Not on file    Non-medical: Not on file  Tobacco Use  . Smoking status: Never Smoker  . Smokeless tobacco: Never  Used  Substance and Sexual Activity  . Alcohol use: No    Alcohol/week: 0.0 standard drinks  . Drug use: No  . Sexual activity: Never    Birth control/protection: Post-menopausal  Lifestyle  . Physical activity:    Days per week: Not on file    Minutes per session: Not on file  . Stress: Not on file  Relationships  . Social connections:    Talks on phone: Not on file    Gets together: Not on file    Attends religious service: Not on file    Active member of club or organization: Not on file    Attends meetings of clubs or organizations: Not on file    Relationship status: Not on file  . Intimate partner violence:    Fear of current or ex partner: Not on file    Emotionally abused: Not on file    Physically abused: Not on file    Forced sexual activity: Not on file  Other Topics Concern  . Not  on file  Social History Narrative  . Not on file    Allergies:  Allergies  Allergen Reactions  . Kiwi Extract Anaphylaxis    Medications: Prior to Admission medications   Medication Sig Start Date End Date Taking? Authorizing Provider  atorvastatin (LIPITOR) 10 MG tablet Take by mouth. 05/21/16  Yes [provider]  B Complex Vitamins (B-COMPLEX/B-12) TABS Take by mouth.   Yes [provider]  Blood Glucose Monitoring Suppl (RA BLOOD GLUCOSE MONITOR) DEVI  12/28/14  Yes [provider]  Calcium-Magnesium-Zinc 500-250-12.5 MG TABS Take 2 tablets by mouth 1 day or 1 dose.   Yes [provider]  Cholecalciferol (VITAMIN D3) 5000 units CAPS Take 1 capsule by mouth daily.   Yes [provider]  Co-Enzyme Q-10 30 MG CAPS Take by mouth.   Yes [provider]  esomeprazole (NEXIUM) 40 MG capsule Take by mouth.   Yes [provider]  ferrous sulfate 325 (65 FE) MG EC tablet Take by mouth.   Yes [provider]  glipiZIDE (GLUCOTROL) 5 MG tablet Take by mouth daily before breakfast.   Yes [provider]  metFORMIN (GLUCOPHAGE) 500 MG tablet Take by mouth 2 (two) times daily with a meal.   Yes [provider]  Omega 3-6-9 CAPS Take by mouth.   Yes [provider]  omeprazole (PRILOSEC) 40 MG capsule  05/20/14  Yes [provider]  rOPINIRole (REQUIP) 3 MG tablet Take by mouth. 02/20/17 02/20/18 Yes [provider]    Physical Exam Vitals: Blood pressure (!) 124/54, pulse 82, height 5\' 3"  (1.6 m), weight 198 lb (89.8 kg).  General: NAD HEENT: normocephalic, anicteric Thyroid: no enlargement, no palpable nodules Pulmonary: No increased work of breathing, CTAB Cardiovascular: RRR, distal pulses 2+ Breast: Breast symmetrical, no tenderness, no palpable nodules or masses, no skin or nipple retraction present, no nipple discharge.  No axillary or supraclavicular  lymphadenopathy. Abdomen: NABS, soft, non-tender, non-distended.  Umbilicus without lesions.  No hepatomegaly, splenomegaly or masses palpable. No evidence of hernia  Genitourinary:  External: Normal external female genitalia.  Normal urethral meatus, normal Bartholin's and Skene's glands.    Vagina: Normal vaginal mucosa, no evidence of prolapse.    Cervix: Grossly normal in appearance, no bleeding  Uterus: Non-enlarged, mobile, normal contour.  No CMT  Adnexa: ovaries non-enlarged, no adnexal masses  Rectal: deferred  Lymphatic: no evidence of inguinal lymphadenopathy Extremities: no edema, erythema, or tenderness  Neurologic: Grossly intact Psychiatric: mood appropriate, affect full  Female chaperone present for pelvic and breast  portions of the physical exam     Assessment: 67 y.o. G2P1102 routine annual exam  Plan: Problem List Items Addressed This Visit    None    Visit Diagnoses    Well woman exam with routine gynecological exam    -  Primary   Relevant Orders   Cytology - PAP   Cervical cancer screening       Relevant Orders   Cytology - PAP      1) Mammogram - recommend yearly screening mammogram.  Mammogram Is up to date  2) STI screening  was offered and declined  3) ASCCP guidelines and rationale discussed.  Patient continues to desire annual screening  4) Osteoporosis  - per USPTF routine screening DEXA at age 9 Will contact patient regarding her Medicare not covering the screening to see if she wants to proceed with testing.   Consider FDA-approved medical therapies in postmenopausal women and men aged 30 years and older, based on the following: a) A hip or vertebral (clinical or morphometric) fracture b) T-score ? -2.5 at the femoral neck or spine after appropriate evaluation to exclude secondary causes C) Low bone mass (T-score between -1.0 and -2.5 at the femoral neck or spine) and a 10-year probability of a hip fracture ? 3% or a 10-year probability  of a major osteoporosis-related fracture ? 20% based on the [place]-adapted WHO algorithm   5) Routine healthcare maintenance including cholesterol, diabetes screening discussed managed by PCP  6) Colonoscopy is up to date- screened 1 month ago.  Screening recommended starting at age 42 for average risk individuals, age 20 for individuals deemed at increased risk (including African Americans) and recommended to continue until age 58.  For patient age 9-85 individualized approach is recommended.  Gold standard screening is via colonoscopy, Cologuard screening is an acceptable alternative for patient unwilling or unable to undergo colonoscopy.  "Colorectal cancer screening for average?risk adults: 2018 guideline update from the American Cancer Society"CA: A Cancer Journal for Clinicians: Jul 10, 2016   7) Return in 1 year (on 12/12/2018).   Rod Can, CNM  [facility], [facility] 12/11/2017, 3:52 PM

## 2017-12-16 LAB — CYTOLOGY - PAP: Diagnosis: NEGATIVE

## 2018-07-22 ENCOUNTER — Other Ambulatory Visit: Payer: Self-pay | Admitting: Internal Medicine

## 2018-07-22 DIAGNOSIS — Z1231 Encounter for screening mammogram for malignant neoplasm of breast: Secondary | ICD-10-CM

## 2018-09-07 ENCOUNTER — Other Ambulatory Visit: Payer: Self-pay

## 2018-09-07 ENCOUNTER — Ambulatory Visit
Admission: RE | Admit: 2018-09-07 | Discharge: 2018-09-07 | Disposition: A | Discharge status: Home / Self Care | Payer: Medicare Other | Source: Ambulatory Visit | Attending: Internal Medicine | Admitting: Internal Medicine

## 2018-09-07 DIAGNOSIS — Z1231 Encounter for screening mammogram for malignant neoplasm of breast: Secondary | ICD-10-CM

## 2019-08-20 ENCOUNTER — Other Ambulatory Visit: Payer: Self-pay | Admitting: Internal Medicine

## 2019-08-20 DIAGNOSIS — Z1231 Encounter for screening mammogram for malignant neoplasm of breast: Secondary | ICD-10-CM

## 2019-09-10 ENCOUNTER — Other Ambulatory Visit: Payer: Self-pay

## 2019-09-10 ENCOUNTER — Ambulatory Visit
Admission: RE | Admit: 2019-09-10 | Discharge: 2019-09-10 | Disposition: A | Discharge status: Home / Self Care | Payer: Medicare Other | Source: Ambulatory Visit | Attending: Internal Medicine | Admitting: Internal Medicine

## 2019-09-10 DIAGNOSIS — Z1231 Encounter for screening mammogram for malignant neoplasm of breast: Secondary | ICD-10-CM

## 2020-04-03 IMAGING — US US ABDOMEN LIMITED
1 series · 14 of 25 positions shown · non-contrast
Comparison: CT, 12/06/2005

CLINICAL DATA: Elevated liver function tests.

EXAM:
ULTRASOUND ABDOMEN LIMITED RIGHT UPPER QUADRANT

[Series 1: us abdomen limited · 0.26mm/px · 14 of 63 slices shown]
[im 1/63]
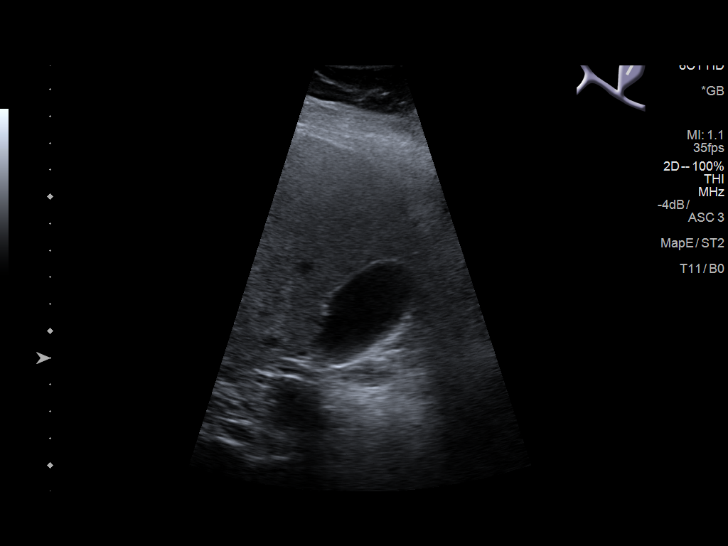
[im 6/63]
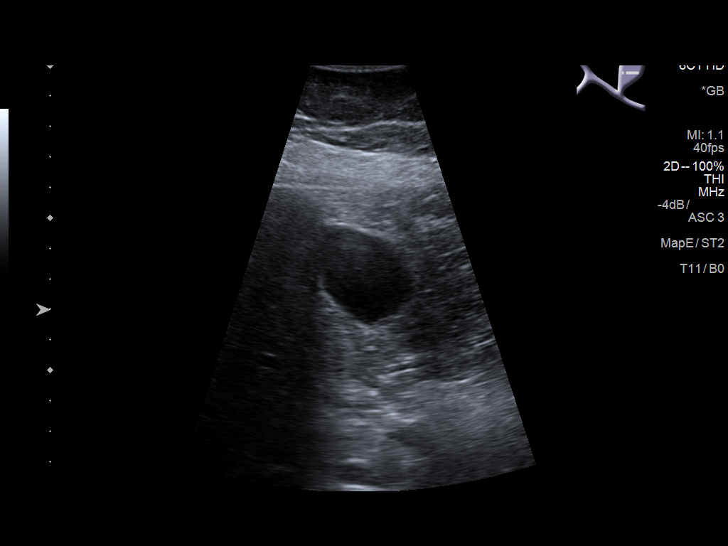
[im 11/63]
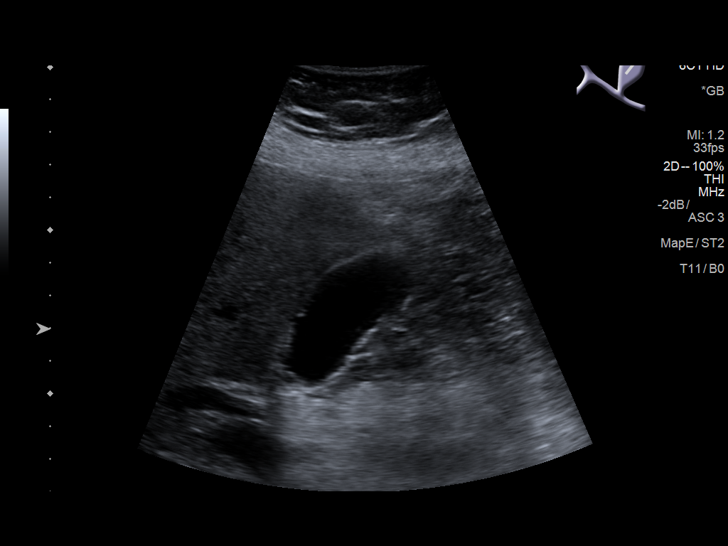
[im 16/63]
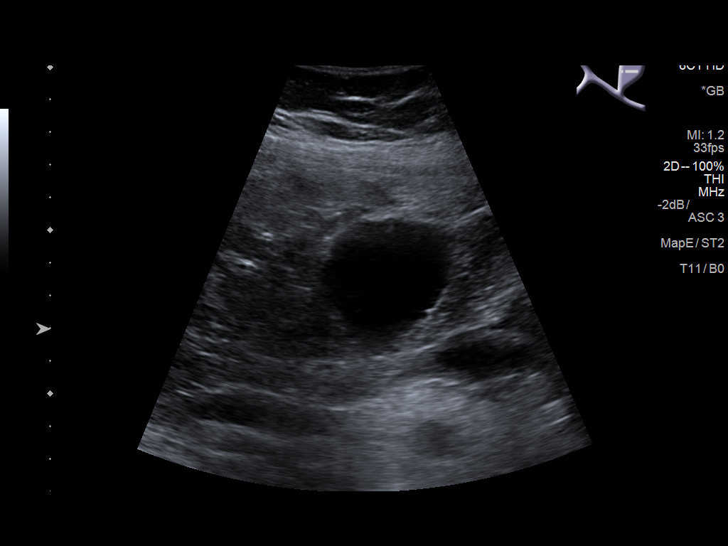
[im 21/63]
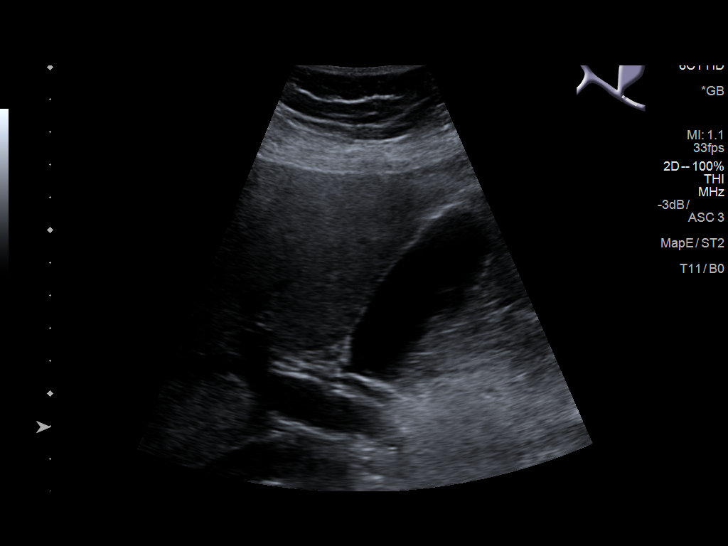
[im 24/63]
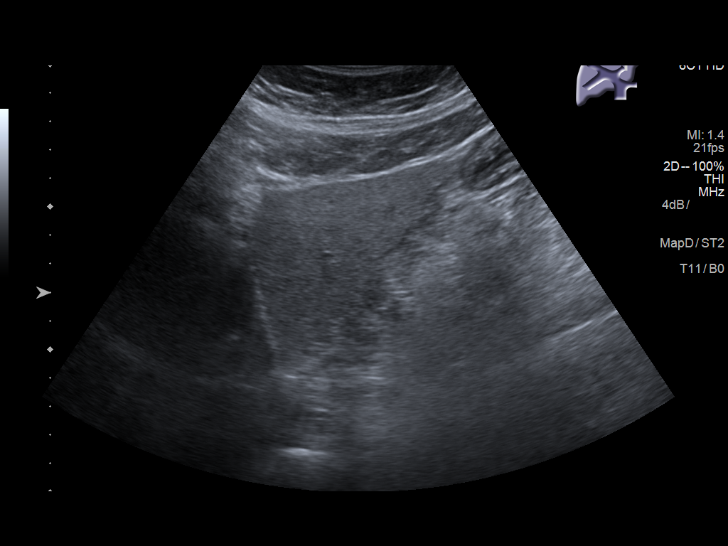
[im 29/63]
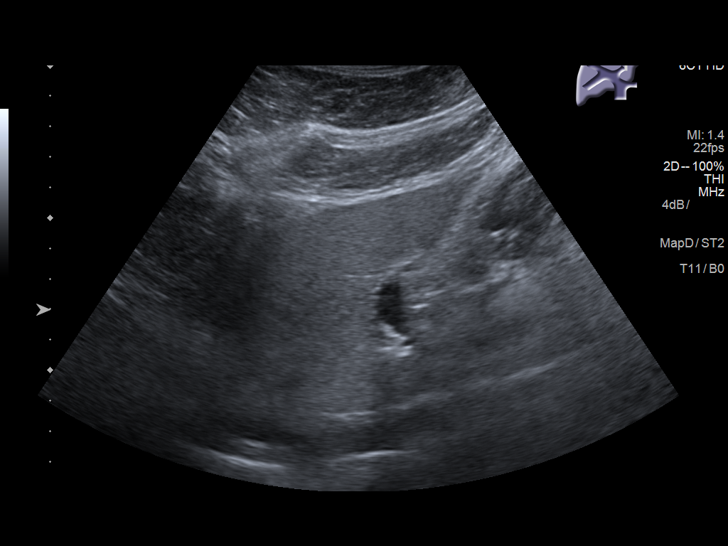
[im 34/63]
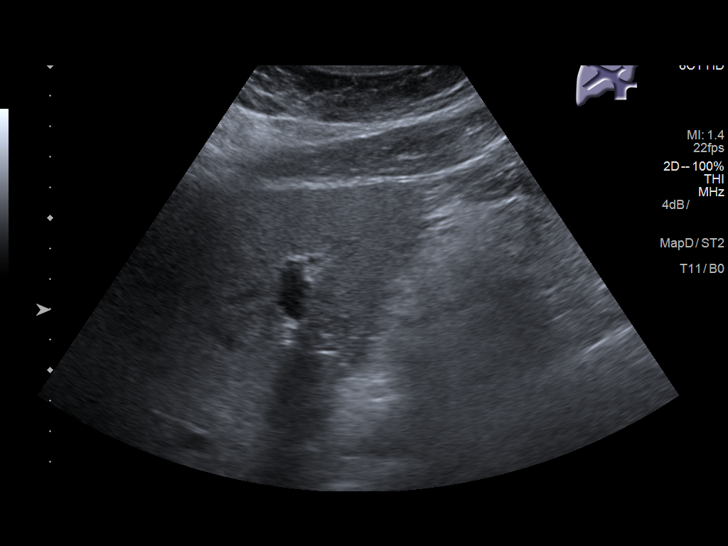
[im 39/63]
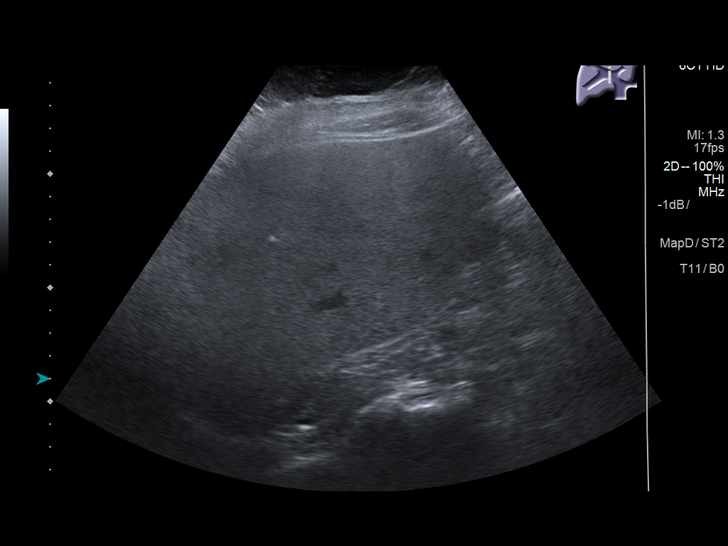
[im 42/63]
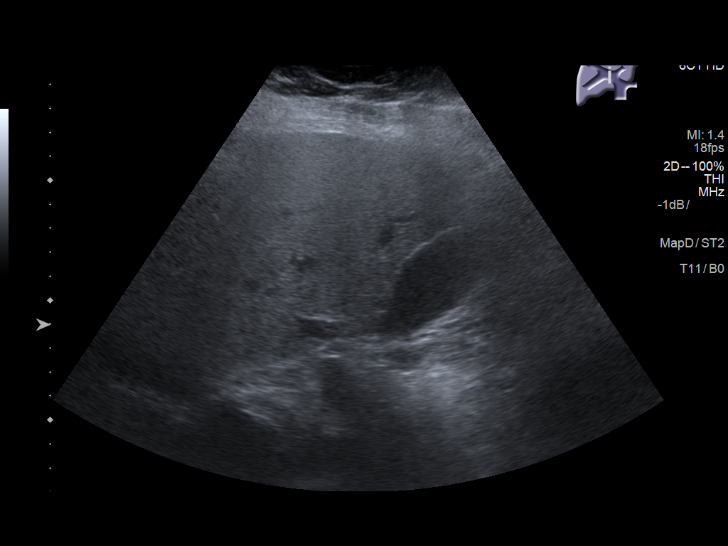
[im 47/63]
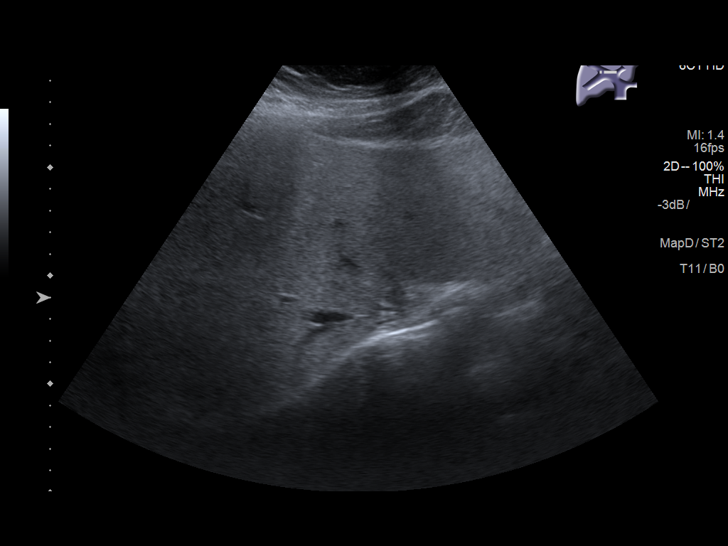
[im 52/63]
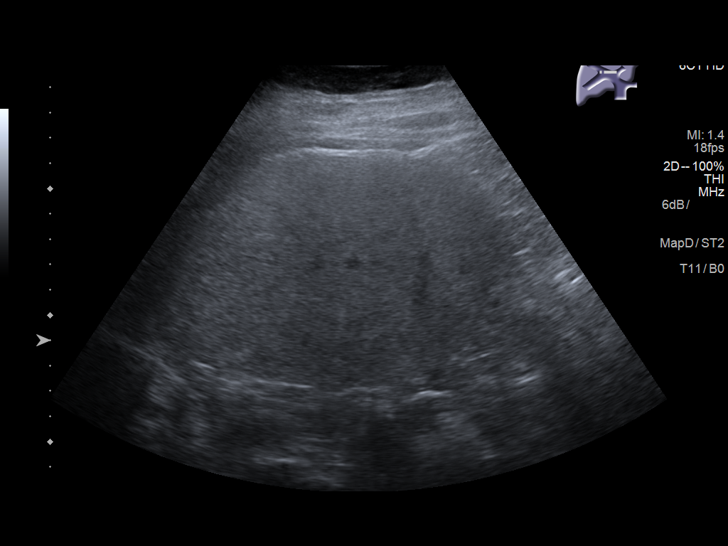
[im 57/63]
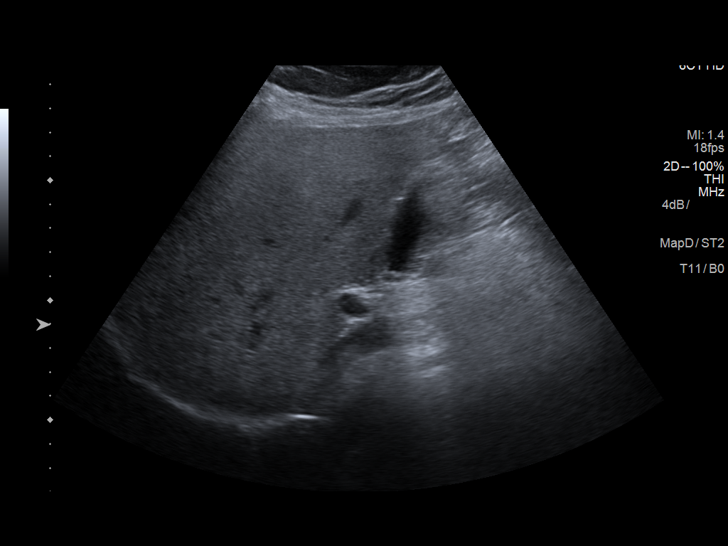
[im 63/63]
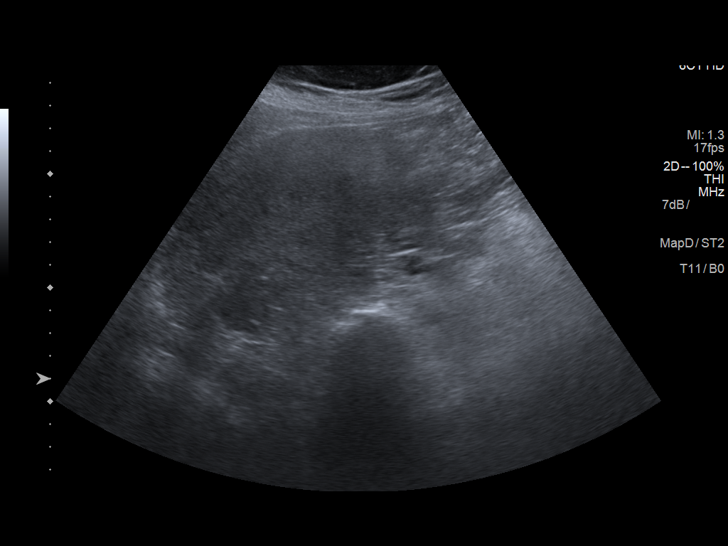

[14 of 25 positions shown; findings below may reference images not displayed]

FINDINGS: Gallbladder:

No gallstones or wall thickening visualized. No sonographic Murphy
sign noted by sonographer.

Common bile duct:

Diameter: 2 mm

Liver:

Increased liver parenchymal echogenicity. No mass or focal lesion.
Portal vein is patent on color Doppler imaging with normal direction
of blood flow towards the liver.
IMPRESSION: 1. No acute findings.  Normal gallbladder.  No bile duct dilation.
2. Hepatic steatosis.

## 2020-07-28 ENCOUNTER — Other Ambulatory Visit: Payer: Self-pay

## 2020-07-28 ENCOUNTER — Other Ambulatory Visit: Payer: Self-pay | Admitting: Internal Medicine

## 2020-07-28 DIAGNOSIS — Z1231 Encounter for screening mammogram for malignant neoplasm of breast: Secondary | ICD-10-CM

## 2020-09-21 ENCOUNTER — Ambulatory Visit
Admission: RE | Admit: 2020-09-21 | Discharge: 2020-09-21 | Disposition: A | Discharge status: Home / Self Care | Payer: Medicare Other | Source: Ambulatory Visit | Attending: Internal Medicine | Admitting: Internal Medicine

## 2020-09-21 ENCOUNTER — Other Ambulatory Visit: Payer: Self-pay

## 2020-09-21 DIAGNOSIS — Z1231 Encounter for screening mammogram for malignant neoplasm of breast: Secondary | ICD-10-CM

## 2021-06-13 ENCOUNTER — Emergency Department: Payer: Medicare Other

## 2021-06-13 ENCOUNTER — Emergency Department
Admission: EM | Admit: 2021-06-13 | Discharge: 2021-06-13 | Disposition: A | Discharge status: Home / Self Care | Payer: Medicare Other | Attending: Emergency Medicine | Admitting: Emergency Medicine

## 2021-06-13 DIAGNOSIS — E119 Type 2 diabetes mellitus without complications: Secondary | ICD-10-CM | POA: Diagnosis not present

## 2021-06-13 DIAGNOSIS — R42 Dizziness and giddiness: Secondary | ICD-10-CM | POA: Diagnosis present

## 2021-06-13 DIAGNOSIS — I10 Essential (primary) hypertension: Secondary | ICD-10-CM | POA: Diagnosis not present

## 2021-06-13 LAB — COMPREHENSIVE METABOLIC PANEL
ALT: 34 U/L (ref 0–44)
AST: 24 U/L (ref 15–41)
Albumin: 4.1 g/dL (ref 3.5–5.0)
Alkaline Phosphatase: 75 U/L (ref 38–126)
Anion gap: 9 (ref 5–15)
BUN: 28 mg/dL — ABNORMAL HIGH (ref 8–23)
CO2: 26 mmol/L (ref 22–32)
Calcium: 9.4 mg/dL (ref 8.9–10.3)
Chloride: 106 mmol/L (ref 98–111)
Creatinine, Ser: 0.96 mg/dL (ref 0.44–1.00)
GFR, Estimated: >60 mL/min (ref >60)
Glucose, Bld: 151 mg/dL — ABNORMAL HIGH (ref 70–99)
Potassium: 3.8 mmol/L (ref 3.5–5.1)
Sodium: 141 mmol/L (ref 135–145)
Total Bilirubin: 0.6 mg/dL (ref 0.3–1.2)
Total Protein: 7.8 g/dL (ref 6.5–8.1)

## 2021-06-13 LAB — URINALYSIS, ROUTINE W REFLEX MICROSCOPIC
Bilirubin Urine: NEGATIVE
Glucose, UA: NEGATIVE mg/dL
Hgb urine dipstick: NEGATIVE
Ketones, ur: NEGATIVE mg/dL
Leukocytes,Ua: NEGATIVE
Nitrite: NEGATIVE
Protein, ur: NEGATIVE mg/dL
Specific Gravity, Urine: 1.019 (ref 1.005–1.030)
pH: 5 (ref 5.0–8.0)

## 2021-06-13 LAB — DIFFERENTIAL
Abs Immature Granulocytes: 0.01 10*3/uL (ref 0.00–0.07)
Basophils Absolute: 0 10*3/uL (ref 0.0–0.1)
Basophils Relative: 1 %
Eosinophils Absolute: 0.1 10*3/uL (ref 0.0–0.5)
Eosinophils Relative: 2 %
Immature Granulocytes: 0 %
Lymphocytes Relative: 39 %
Lymphs Abs: 2.4 10*3/uL (ref 0.7–4.0)
Monocytes Absolute: 0.5 10*3/uL (ref 0.1–1.0)
Monocytes Relative: 8 %
Neutro Abs: 3.1 10*3/uL (ref 1.7–7.7)
Neutrophils Relative %: 50 %

## 2021-06-13 LAB — CBC
HCT: 41.8 % (ref 36.0–46.0)
Hemoglobin: 13.7 g/dL (ref 12.0–15.0)
MCH: 30.2 pg (ref 26.0–34.0)
MCHC: 32.8 g/dL (ref 30.0–36.0)
MCV: 92.3 fL (ref 80.0–100.0)
Platelets: 208 10*3/uL (ref 150–400)
RBC: 4.53 MIL/uL (ref 3.87–5.11)
RDW: 12.6 % (ref 11.5–15.5)
WBC: 6.1 10*3/uL (ref 4.0–10.5)
nRBC: 0 % (ref 0.0–0.2)

## 2021-06-13 LAB — PROTIME-INR
INR: 1 (ref 0.8–1.2)
Prothrombin Time: 13 seconds (ref 11.4–15.2)

## 2021-06-13 LAB — APTT: aPTT: 34 seconds (ref 24–36)

## 2021-06-13 LAB — CBG MONITORING, ED: Glucose-Capillary: 154 mg/dL — ABNORMAL HIGH (ref 70–99)

## 2021-06-13 MED ORDER — SODIUM CHLORIDE 0.9% FLUSH: 3.0000 mL | Freq: Once | INTRAVENOUS | Status: DC

## 2021-06-13 MED ORDER — LOSARTAN POTASSIUM 50 MG PO TABS
25.0000 mg | ORAL_TABLET | Freq: Once | ORAL | Status: AC
Start: 2021-06-13 — End: 2021-06-13
  Administered 2021-06-13: 25 mg via ORAL
  Filled 2021-06-13: qty 1

## 2021-06-13 MED ORDER — LOSARTAN POTASSIUM 25 MG PO TABS: 25.0000 mg | ORAL_TABLET | Freq: Every day | ORAL | 11 refills | Status: AC

## 2021-06-13 NOTE — ED Triage Notes (Addendum)
Pt comes pov sent by PCP for dizziness and "funny feeling" in her head. States she has had multiple "episodes" of this but states that she has not felt completely normal since yesterday morning when she woke up.  ? ?Has had consistent cough and stuff nose for "weeks".  ?

## 2021-06-13 NOTE — ED Notes (Signed)
Pt does NOT take any thing for BP ?

## 2021-06-13 NOTE — ED Provider Notes (Signed)
? ?[facility] ?Provider Note ? ? Event Date/Time  ? First MD Initiated Contact with Patient 06/13/21 1107   ?  (approximate) ?History  ?Dizziness ? ?HPI ?Stephanie Freeman is a 71 y.o. female with a stated past medical history of diabetes who presents for 2 days of orthostatic lightheadedness.  Patient states that when she awoke yesterday morning she got up to go the bathroom and began feeling lightheadedness with the sensation of "tunnel vision".  Patient states that she sat on the toilet and symptoms gradually resolved.  Patient states that she has had multiple episodes so far yesterday and today of similar symptoms when standing from a seated position or getting up from lying down.  Patient denies any similar symptoms in the past.  Patient does state that she has had seasonal allergies lately including sinus congestion and clear rhinorrhea over the past 2 weeks.  Patient denies any history of hypertension or taking any antihypertensives.  Patient currently denies any vision changes, tinnitus, difficulty speaking, facial droop, sore throat, chest pain, shortness of breath, abdominal pain, nausea/vomiting/diarrhea, dysuria, or weakness/numbness/paresthesias in any extremity ?Physical Exam  ?Triage Vital Signs: ?ED Triage Vitals  ?Enc Vitals Group  ?   BP 06/13/21 1058 (!) 180/70  ?   Pulse Rate 06/13/21 1058 73  ?   Resp 06/13/21 1058 18  ?   Temp 06/13/21 1058 98.4 ?F (36.9 ?C)  ?   Temp Source 06/13/21 1058 Oral  ?   SpO2 06/13/21 1058 96 %  ?   Weight 06/13/21 1056 195 lb (88.5 kg)  ?   Height --   ?   Head Circumference --   ?   Peak Flow --   ?   Pain Score 06/13/21 1056 0  ?   Pain Loc --   ?   Pain Edu? --   ?   Excl. in [place]? --   ? ?Most recent vital signs: ?Vitals:  ? 06/13/21 1058 06/13/21 1215  ?BP: (!) 180/70 (!) 132/91  ?Pulse: 73 70  ?Resp: 18   ?Temp: 98.4 ?F (36.9 ?C)   ?SpO2: 96% 98%  ? ?General: Awake, oriented x4. ?CV:  Good peripheral perfusion.  ?Resp:  Normal effort.   ?Abd:  No distention.  ?Other:  Negative orthostatics.  Overweight elderly Caucasian female sitting on side of the bed in no distress ?ED Results / Procedures / Treatments  ?Labs ?(all labs ordered are listed, but only abnormal results are displayed) ?Labs Reviewed  ?COMPREHENSIVE METABOLIC PANEL - Abnormal; Notable for the following components:  ?    Result Value  ? Glucose, Bld 151 (*)   ? BUN 28 (*)   ? All other components within normal limits  ?URINALYSIS, ROUTINE W REFLEX MICROSCOPIC - Abnormal; Notable for the following components:  ? Color, Urine STRAW (*)   ? APPearance CLEAR (*)   ? All other components within normal limits  ?CBG MONITORING, ED - Abnormal; Notable for the following components:  ? Glucose-Capillary 154 (*)   ? All other components within normal limits  ?PROTIME-INR  ?APTT  ?CBC  ?DIFFERENTIAL  ? ?EKG ?ED ECG REPORT ?I, Naaman Plummer, the attending physician, personally viewed and interpreted this ECG. ?Date: 06/13/2021 ?EKG Time: 1059 ?Rate: 72 ?Rhythm: normal sinus rhythm ?QRS Axis: normal ?Intervals: normal ?ST/T Wave abnormalities: normal ?Narrative Interpretation: no evidence of acute ischemia ?RADIOLOGY ?ED MD interpretation: CT of the head without contrast interpreted by me shows no evidence of acute abnormalities including  no intracerebral hemorrhage, obvious masses, or significant edema ?-Agree with radiology assessment ?Official radiology report(s): ?CT HEAD WO CONTRAST ? ?Result Date: 06/13/2021 ?CLINICAL DATA:  Acute neuro deficit. Stroke suspected. Dizziness and "funny feeling" in head. EXAM: CT HEAD WITHOUT CONTRAST TECHNIQUE: Contiguous axial images were obtained from the base of the skull through the vertex without intravenous contrast. RADIATION DOSE REDUCTION: This exam was performed according to the departmental dose-optimization program which includes automated exposure control, adjustment of the mA and/or kV according to patient size and/or use of iterative  reconstruction technique. COMPARISON:  None Available. FINDINGS: Brain: There is mild cortical atrophy, within normal limits for patient age. The ventricles are normal in configuration. The basilar cisterns are patent. No mass, mass effect, or midline shift. No acute intracranial hemorrhage is seen. No abnormal extra-axial fluid collection. Minimal periventricular white matter hypodensities, nonspecific but most likely secondary to chronic ischemic white matter changes. Preservation of the normal cortical gray-white interface without CT evidence of an acute major vascular territorial cortical based infarction. Vascular: No hyperdense vessel or unexpected calcification. Skull: Normal. Negative for fracture or focal lesion. Sinuses/Orbits: The visualized orbits are unremarkable. The visualized paranasal sinuses and mastoid air cells are clear. Other: None. IMPRESSION: No acute intracranial process. Electronically Signed   By: Yvonne Kendall M.D.   On: 06/13/2021 11:30   ?PROCEDURES: ?Critical Care performed: No ?.1-3 Lead EKG Interpretation ?Performed by: Naaman Plummer, MD ?Authorized by: Naaman Plummer, MD  ? ?  Interpretation: normal   ?  ECG rate:  65 ?  ECG rate assessment: normal   ?  Rhythm: sinus rhythm   ?  Ectopy: none   ?  Conduction: normal   ?MEDICATIONS ORDERED IN ED: ?Medications  ?sodium chloride flush (NS) 0.9 % injection 3 mL (has no administration in time range)  ?losartan (COZAAR) tablet 25 mg (25 mg Oral Given 06/13/21 1226)  ? ?IMPRESSION / MDM / ASSESSMENT AND PLAN / ED COURSE  ?I reviewed the triage vital signs and the nursing notes. ?             ?               ? ?Based on History, Exam, and Findings, presentation not consistent with syncope, seizure, stroke, meningitis, symptomatic anemia (gastrointestinal bleed), Increased ICP (cerebral tumor/mass), ICH. Additionally, I have a low suspicion for AOM, labyrinthitis, or other infectious process. ? ?Reassessment: Prior to discharge symptoms  controlled, patient well appearing. ?Disposition:  Discharge. Strict return precautions discussed w/ full understanding. Advise follow up with primary care provider within 24-48 hours. ? ?  ?FINAL CLINICAL IMPRESSION(S) / ED DIAGNOSES  ? ?Final diagnoses:  ?Lightheadedness  ?Primary hypertension  ? ?Rx / DC Orders  ? ?ED Discharge Orders   ? ?      Ordered  ?  losartan (COZAAR) 25 MG tablet  Daily       ? 06/13/21 1235  ? ?  ?  ? ?  ? ?Note:  This document was prepared using Dragon voice recognition software and may include unintentional dictation errors. ?  ?Naaman Plummer, MD ?06/13/21 1244 ? ?

## 2021-10-31 ENCOUNTER — Other Ambulatory Visit: Payer: Self-pay | Admitting: Internal Medicine

## 2021-10-31 DIAGNOSIS — Z1231 Encounter for screening mammogram for malignant neoplasm of breast: Secondary | ICD-10-CM

## 2021-11-27 ENCOUNTER — Ambulatory Visit: Payer: Medicare Other

## 2021-12-18 ENCOUNTER — Ambulatory Visit
Admission: RE | Admit: 2021-12-18 | Discharge: 2021-12-18 | Disposition: A | Discharge status: Home / Self Care | Payer: Medicare Other | Source: Ambulatory Visit | Attending: Internal Medicine | Admitting: Internal Medicine

## 2021-12-18 DIAGNOSIS — Z1231 Encounter for screening mammogram for malignant neoplasm of breast: Secondary | ICD-10-CM

## 2022-11-04 ENCOUNTER — Other Ambulatory Visit: Payer: Self-pay | Admitting: Internal Medicine

## 2022-11-04 DIAGNOSIS — Z1231 Encounter for screening mammogram for malignant neoplasm of breast: Secondary | ICD-10-CM

## 2022-12-30 ENCOUNTER — Ambulatory Visit: Payer: Medicare Other

## 2023-01-22 ENCOUNTER — Ambulatory Visit
Admission: RE | Admit: 2023-01-22 | Discharge: 2023-01-22 | Disposition: A | Discharge status: Home / Self Care | Payer: Medicare Other | Source: Ambulatory Visit | Attending: Internal Medicine | Admitting: Internal Medicine

## 2023-01-22 DIAGNOSIS — Z1231 Encounter for screening mammogram for malignant neoplasm of breast: Secondary | ICD-10-CM

## 2023-02-26 ENCOUNTER — Other Ambulatory Visit: Payer: Self-pay | Admitting: Unknown Physician Specialty

## 2023-02-26 DIAGNOSIS — H9121 Sudden idiopathic hearing loss, right ear: Secondary | ICD-10-CM

## 2023-03-12 ENCOUNTER — Other Ambulatory Visit: Payer: Medicare Other

## 2023-03-24 ENCOUNTER — Ambulatory Visit
Admission: RE | Admit: 2023-03-24 | Discharge: 2023-03-24 | Disposition: A | Discharge status: Home / Self Care | Payer: Medicare Other | Source: Ambulatory Visit | Attending: Unknown Physician Specialty | Admitting: Unknown Physician Specialty

## 2023-03-24 DIAGNOSIS — H9121 Sudden idiopathic hearing loss, right ear: Secondary | ICD-10-CM

## 2023-03-24 MED ORDER — GADOPICLENOL 0.5 MMOL/ML IV SOLN
10.0000 mL | Freq: Once | INTRAVENOUS | Status: AC | PRN
  Administered 2023-03-24: 10 mL via INTRAVENOUS

## 2023-04-16 NOTE — H&P (Signed)
 Pre-Procedure H&P   Patient ID: Stephanie Freeman is a 73 y.o. female.  Gastroenterology Provider: Jaynie Collins, DO  Referring Provider: Tawni Pummel, PA PCP: Gracelyn Nurse, MD  Date: 04/17/2023  HPI Ms. Stephanie Freeman is a 73 y.o. female who presents today for Colonoscopy for Personal history of colon polyps .  Patient last underwent colonoscopy in 2019 with 1 adenomatous polyp, internal hemorrhoids and diverticulosis on the left side.  She also had adenomatous polyps in 2013 and 2009.  Patient notes 4 loose bowel movements per day.  Increasing symptoms with increase in caffeine.  No melena or hematochezia. Hemoglobin 13.5 MCV 94 platelets 193,000  Maternal aunt with colorectal cancer and maternal grandmother with gastric cancer Status post C-section x 2   Past Medical History:  Diagnosis Date   Diabetes mellitus without complication (HCC)    GERD (gastroesophageal reflux disease)    Hypercholesterolemia    Sleep apnea     Past Surgical History:  Procedure Laterality Date   CARPAL TUNNEL RELEASE     LEFT   CESAREAN SECTION     X2   COLONOSCOPY WITH PROPOFOL N/A 11/05/2017   Procedure: COLONOSCOPY WITH PROPOFOL;  Surgeon: Scot Jun, MD;  Location: [facility] ENDOSCOPY;  Service: Endoscopy;  Laterality: N/A;   DILATION AND CURETTAGE OF UTERUS     LAP   ROTATOR CUFF REPAIR      Family History Maternal aunt with colorectal cancer and maternal grandmother with gastric cancer No other h/o GI disease or malignancy  Review of Systems  Constitutional:  Negative for activity change, appetite change, chills, diaphoresis, fatigue, fever and unexpected weight change.  HENT:  Negative for trouble swallowing and voice change.   Respiratory:  Negative for shortness of breath and wheezing.   Cardiovascular:  Negative for chest pain, palpitations and leg swelling.  Gastrointestinal:  Negative for abdominal distention, abdominal pain, anal bleeding, blood in stool,  constipation, diarrhea, nausea, rectal pain and vomiting.  Musculoskeletal:  Negative for arthralgias and myalgias.  Skin:  Negative for color change and pallor.  Neurological:  Negative for dizziness, syncope and weakness.  Psychiatric/Behavioral:  Negative for confusion.   All other systems reviewed and are negative.    Medications No current facility-administered medications on file prior to encounter.   Current Outpatient Medications on File Prior to Encounter  Medication Sig Dispense Refill   atorvastatin (LIPITOR) 10 MG tablet Take by mouth.     losartan (COZAAR) 25 MG tablet Take 1 tablet (25 mg total) by mouth daily. 30 tablet 11   rOPINIRole (REQUIP) 3 MG tablet Take by mouth.     B Complex Vitamins (B-COMPLEX/B-12) TABS Take by mouth.     Blood Glucose Monitoring Suppl (RA BLOOD GLUCOSE MONITOR) DEVI      Calcium-Magnesium-Zinc 500-250-12.5 MG TABS Take 2 tablets by mouth 1 day or 1 dose.     Cholecalciferol (VITAMIN D3) 5000 units CAPS Take 1 capsule by mouth daily.     Co-Enzyme Q-10 30 MG CAPS Take by mouth.     esomeprazole (NEXIUM) 40 MG capsule Take by mouth.     ferrous sulfate 325 (65 FE) MG EC tablet Take by mouth. (Patient not taking: Reported on 04/10/2023)     glipiZIDE (GLUCOTROL) 5 MG tablet Take by mouth daily before breakfast.     metFORMIN (GLUCOPHAGE) 500 MG tablet Take by mouth 2 (two) times daily with a meal. (Patient not taking: Reported on 04/10/2023)     Omega 3-6-9 CAPS  Take by mouth.     omeprazole (PRILOSEC) 40 MG capsule   0    Pertinent medications related to GI and procedure were reviewed by me with the patient prior to the procedure   Current Facility-Administered Medications:    0.9 %  sodium chloride infusion, , Intravenous, Continuous, Jaynie Collins, DO  sodium chloride         Allergies  Allergen Reactions   Kiwi Extract Anaphylaxis   Allergies were reviewed by me prior to the procedure  Objective   Body mass index is  34.72 kg/m. Vitals:   04/17/23 0702 04/17/23 0716  BP:  (!) 147/65  Pulse:  85  Resp:  18  Temp:  (!) 97 F (36.1 C)  TempSrc:  Temporal  SpO2:  99%  Weight: 88.9 kg   Height: 5\' 3"  (1.6 m)      Physical Exam Vitals and nursing note reviewed.  Constitutional:      General: She is not in acute distress.    Appearance: Normal appearance. She is not ill-appearing, toxic-appearing or diaphoretic.  HENT:     Head: Normocephalic and atraumatic.     Nose: Nose normal.     Mouth/Throat:     Mouth: Mucous membranes are moist.     Pharynx: Oropharynx is clear.  Eyes:     General: No scleral icterus.    Extraocular Movements: Extraocular movements intact.  Cardiovascular:     Rate and Rhythm: Normal rate and regular rhythm.     Heart sounds: Normal heart sounds. No murmur heard.    No friction rub. No gallop.  Pulmonary:     Effort: Pulmonary effort is normal. No respiratory distress.     Breath sounds: Normal breath sounds. No wheezing, rhonchi or rales.  Abdominal:     General: Bowel sounds are normal. There is no distension.     Palpations: Abdomen is soft.     Tenderness: There is no abdominal tenderness. There is no guarding or rebound.  Musculoskeletal:     Cervical back: Neck supple.     Right lower leg: No edema.     Left lower leg: No edema.  Skin:    General: Skin is warm and dry.     Coloration: Skin is not jaundiced or pale.  Neurological:     General: No focal deficit present.     Mental Status: She is alert and oriented to person, place, and time. Mental status is at baseline.  Psychiatric:        Mood and Affect: Mood normal.        Behavior: Behavior normal.        Thought Content: Thought content normal.        Judgment: Judgment normal.      Assessment:  Ms. Stephanie Freeman is a 73 y.o. female  who presents today for Colonoscopy for personal history of colon polyps.  Plan:  Colonoscopy with possible intervention today  Colonoscopy with possible  biopsy, control of bleeding, polypectomy, and interventions as necessary has been discussed with the patient/patient representative. Informed consent was obtained from the patient/patient representative after explaining the indication, nature, and risks of the procedure including but not limited to death, bleeding, perforation, missed neoplasm/lesions, cardiorespiratory compromise, and reaction to medications. Opportunity for questions was given and appropriate answers were provided. Patient/patient representative has verbalized understanding is amenable to undergoing the procedure.   Jaynie Collins, DO  [facility]  Portions of the record may have been  created with voice recognition software. Occasional wrong-word or 'sound-a-like' substitutions may have occurred due to the inherent limitations of voice recognition software.  Read the chart carefully and recognize, using context, where substitutions may have occurred.

## 2023-04-17 ENCOUNTER — Ambulatory Visit: Admitting: Registered Nurse

## 2023-04-17 ENCOUNTER — Encounter: Payer: Self-pay | Admitting: Gastroenterology

## 2023-04-17 ENCOUNTER — Ambulatory Visit
Admission: RE | Admit: 2023-04-17 | Discharge: 2023-04-17 | Disposition: A | Discharge status: Home / Self Care | Payer: Medicare Other | Attending: Gastroenterology | Admitting: Gastroenterology

## 2023-04-17 ENCOUNTER — Encounter
Admission: RE | Disposition: A | Discharge status: Home / Self Care | Payer: Self-pay | Source: Home / Self Care | Attending: Gastroenterology

## 2023-04-17 DIAGNOSIS — K5730 Diverticulosis of large intestine without perforation or abscess without bleeding: Secondary | ICD-10-CM | POA: Insufficient documentation

## 2023-04-17 DIAGNOSIS — K219 Gastro-esophageal reflux disease without esophagitis: Secondary | ICD-10-CM | POA: Insufficient documentation

## 2023-04-17 DIAGNOSIS — K635 Polyp of colon: Secondary | ICD-10-CM | POA: Diagnosis not present

## 2023-04-17 DIAGNOSIS — Z1211 Encounter for screening for malignant neoplasm of colon: Secondary | ICD-10-CM | POA: Insufficient documentation

## 2023-04-17 DIAGNOSIS — Z7984 Long term (current) use of oral hypoglycemic drugs: Secondary | ICD-10-CM | POA: Diagnosis not present

## 2023-04-17 DIAGNOSIS — G4730 Sleep apnea, unspecified: Secondary | ICD-10-CM | POA: Insufficient documentation

## 2023-04-17 DIAGNOSIS — Z800 Family history of malignant neoplasm of digestive organs: Secondary | ICD-10-CM | POA: Diagnosis not present

## 2023-04-17 DIAGNOSIS — K640 First degree hemorrhoids: Secondary | ICD-10-CM | POA: Insufficient documentation

## 2023-04-17 DIAGNOSIS — E119 Type 2 diabetes mellitus without complications: Secondary | ICD-10-CM | POA: Diagnosis not present

## 2023-04-17 HISTORY — PX: POLYPECTOMY: SHX5525

## 2023-04-17 HISTORY — PX: COLONOSCOPY WITH PROPOFOL: SHX5780

## 2023-04-17 LAB — GLUCOSE, CAPILLARY: Glucose-Capillary: 167 mg/dL — ABNORMAL HIGH (ref 70–99)

## 2023-04-17 SURGERY — COLONOSCOPY WITH PROPOFOL
Anesthesia: General

## 2023-04-17 MED ORDER — SODIUM CHLORIDE 0.9 % IV SOLN: INTRAVENOUS | Status: DC

## 2023-04-17 MED ORDER — PROPOFOL 1000 MG/100ML IV EMUL
INTRAVENOUS | Status: AC
  Filled 2023-04-17: qty 100

## 2023-04-17 MED ORDER — PROPOFOL 500 MG/50ML IV EMUL
INTRAVENOUS | Status: DC | PRN
  Administered 2023-04-17: 75 ug/kg/min via INTRAVENOUS

## 2023-04-17 MED ORDER — PROPOFOL 10 MG/ML IV BOLUS
INTRAVENOUS | Status: AC
  Filled 2023-04-17: qty 20

## 2023-04-17 MED ORDER — LIDOCAINE HCL (CARDIAC) PF 100 MG/5ML IV SOSY
PREFILLED_SYRINGE | INTRAVENOUS | Status: DC | PRN
  Administered 2023-04-17: 40 mg via INTRAVENOUS

## 2023-04-17 MED ORDER — PROPOFOL 10 MG/ML IV BOLUS
INTRAVENOUS | Status: DC | PRN
  Administered 2023-04-17: 60 mg via INTRAVENOUS

## 2023-04-17 MED ORDER — LIDOCAINE HCL (PF) 2 % IJ SOLN
INTRAMUSCULAR | Status: AC
  Filled 2023-04-17: qty 5

## 2023-04-17 NOTE — Anesthesia Preprocedure Evaluation (Signed)
 Anesthesia Evaluation  Patient identified by MRN, date of birth, ID band Patient awake    Reviewed: Allergy & Precautions, H&P , NPO status , Patient's Chart, lab work & pertinent test results, reviewed documented beta blocker date and time   History of Anesthesia Complications Negative for: history of anesthetic complications  Airway Mallampati: III  TM Distance: >3 FB Neck ROM: full    Dental  (+) Dental Advidsory Given, Caps, Missing   Pulmonary neg shortness of breath, sleep apnea , neg COPD, Recent URI , Resolved   Pulmonary exam normal breath sounds clear to auscultation       Cardiovascular Exercise Tolerance: Good hypertension, (-) angina (-) Past MI and (-) Cardiac Stents Normal cardiovascular exam(-) dysrhythmias (-) Valvular Problems/Murmurs Rhythm:regular Rate:Normal     Neuro/Psych negative neurological ROS  negative psych ROS   GI/Hepatic Neg liver ROS,GERD  ,,  Endo/Other  diabetes, Well Controlled, Type 2, Oral Hypoglycemic Agents    Renal/GU negative Renal ROS  negative genitourinary   Musculoskeletal   Abdominal   Peds  Hematology negative hematology ROS (+)   Anesthesia Other Findings Past Medical History: No date: Diabetes mellitus without complication (HCC) No date: GERD (gastroesophageal reflux disease) No date: Hypercholesterolemia No date: Sleep apnea   Reproductive/Obstetrics negative OB ROS                             Anesthesia Physical Anesthesia Plan  ASA: 3  Anesthesia Plan: General   Post-op Pain Management:    Induction: Intravenous  PONV Risk Score and Plan: 3 and Propofol infusion and TIVA  Airway Management Planned: Natural Airway and Nasal Cannula  Additional Equipment:   Intra-op Plan:   Post-operative Plan:   Informed Consent: I have reviewed the patients History and Physical, chart, labs and discussed the procedure including the  risks, benefits and alternatives for the proposed anesthesia with the patient or authorized representative who has indicated his/her understanding and acceptance.     Dental Advisory Given  Plan Discussed with: Anesthesiologist, CRNA and Surgeon  Anesthesia Plan Comments:        Anesthesia Quick Evaluation

## 2023-04-17 NOTE — Op Note (Signed)
 [facility] Patient Name: Stephanie Freeman Procedure Date: 04/17/2023 7:33 AM MRN: [id number] Account #: [id number] Date of Birth: 1950-11-17 Admit Type: Outpatient Age: 73 Room: [facility] ENDO ROOM 1 Gender: Female Note Status: Finalized Instrument Name: Colonoscope [id number] Colonoscope [id number] Procedure:             Colonoscopy Indications:           High risk colon cancer surveillance: Personal history                         of colonic polyps Providers:             Trenda Moots, DO Referring MD:          Gracelyn Nurse, MD (Referring MD) Medicines:             Monitored Anesthesia Care Complications:         No immediate complications. Estimated blood loss:                         Minimal. Procedure:             Pre-Anesthesia Assessment:                        - Prior to the procedure, a History and Physical was                         performed, and patient medications and allergies were                         reviewed. The patient is competent. The risks and                         benefits of the procedure and the sedation options and                         risks were discussed with the patient. All questions                         were answered and informed consent was obtained.                         Patient identification and proposed procedure were                         verified by the physician, the nurse, the anesthetist                         and the technician in the endoscopy suite. Mental                         Status Examination: alert and oriented. Airway                         Examination: normal oropharyngeal airway and neck                         mobility. Respiratory Examination: clear to  auscultation. CV Examination: RRR, no murmurs, no S3                         or S4. Prophylactic Antibiotics: The patient does not                         require prophylactic antibiotics. Prior                          Anticoagulants: The patient has taken no anticoagulant                         or antiplatelet agents. ASA Grade Assessment: III - A                         patient with severe systemic disease. After reviewing                         the risks and benefits, the patient was deemed in                         satisfactory condition to undergo the procedure. The                         anesthesia plan was to use monitored anesthesia care                         (MAC). Immediately prior to administration of                         medications, the patient was re-assessed for adequacy                         to receive sedatives. The heart rate, respiratory                         rate, oxygen saturations, blood pressure, adequacy of                         pulmonary ventilation, and response to care were                         monitored throughout the procedure. The physical                         status of the patient was re-assessed after the                         procedure.                        After obtaining informed consent, the colonoscope was                         passed under direct vision. Throughout the procedure,                         the patient's blood pressure, pulse, and oxygen  saturations were monitored continuously. The                         Colonoscope was introduced through the anus and                         advanced to the the sigmoid colon. The colonoscopy was                         somewhat difficult due to multiple diverticula in the                         colon and restricted mobility of the colon. Successful                         completion of the procedure was aided by withdrawing                         the scope and replacing with the pediatric colonoscope                         and lavage. The patient tolerated the procedure well.                         The quality of the bowel preparation was  evaluated                         using the BBPS [facility] Bowel Preparation Scale) with                         scores of: Right Colon = 3 (entire mucosa seen well                         with no residual staining, small fragments of stool or                         opaque liquid), Transverse Colon = 3 (entire mucosa                         seen well with no residual staining, small fragments                         of stool or opaque liquid) and Left Colon = 2 (minor                         amount of residual staining, small fragments of stool                         and/or opaque liquid, but mucosa seen well). The total                         BBPS score equals 8. The quality of the bowel                         preparation was excellent. The Colonoscope was  introduced through the anus and advanced to the the                         terminal ileum, with identification of the appendiceal                         orifice and IC valve. Findings:      The perianal and digital rectal examinations were normal. Pertinent       negatives include normal sphincter tone.      The terminal ileum appeared normal. Estimated blood loss: none.      Two sessile polyps were found in the transverse colon and ascending       colon. The polyps were 1 to 2 mm in size. These polyps were removed with       a jumbo cold forceps. Resection and retrieval were complete. Estimated       blood loss was minimal.      Normal mucosa was found in the entire colon. Biopsies for histology were       taken with a cold forceps from the right colon and left colon for       evaluation of microscopic colitis. Estimated blood loss was minimal.      Multiple small-mouthed diverticula were found in the left colon.       Estimated blood loss: none.      Non-bleeding internal hemorrhoids were found during retroflexion. The       hemorrhoids were Grade I (internal hemorrhoids that do not prolapse).      The  exam was otherwise without abnormality on direct and retroflexion       views. Impression:            - The examined portion of the ileum was normal.                        - Two 1 to 2 mm polyps in the transverse colon and in                         the ascending colon, removed with a jumbo cold                         forceps. Resected and retrieved.                        - Normal mucosa in the entire examined colon. Biopsied.                        - Diverticulosis in the left colon.                        - Non-bleeding internal hemorrhoids.                        - The examination was otherwise normal on direct and                         retroflexion views. Recommendation:        - Patient has a contact number available for  emergencies. The signs and symptoms of potential                         delayed complications were discussed with the patient.                         Return to normal activities tomorrow. Written                         discharge instructions were provided to the patient.                        - Discharge patient to home.                        - Resume previous diet.                        - Continue present medications.                        - Await pathology results.                        - Repeat colonoscopy for surveillance based on                         pathology results.                        - Return to GI office as previously scheduled.                        - The findings and recommendations were discussed with                         the patient. Diagnosis Code(s):     --- Professional ---                        Z86.010, Personal history of colonic polyps                        K64.0, First degree hemorrhoids                        D12.3, Benign neoplasm of transverse colon (hepatic                         flexure or splenic flexure)                        D12.2, Benign neoplasm of ascending colon                         K57.30, Diverticulosis of large intestine without                         perforation or abscess without bleeding Attending Participation:      I personally performed the entire procedure. Elfredia Nevins, DO Jaynie Collins DO, DO 04/17/2023 8:14:15 AM This report has been signed electronically. Number of Addenda: 0 Note Initiated On:  04/17/2023 7:33 AM Scope Withdrawal Time: 0 hours 13 minutes 35 seconds  Total Procedure Duration: 0 hours 24 minutes 28 seconds  Estimated Blood Loss:  Estimated blood loss was minimal.      [facility]

## 2023-04-17 NOTE — Interval H&P Note (Signed)
 History and Physical Interval Note: Preprocedure H&P from 04/17/23  was reviewed and there was no interval change after seeing and examining the patient.  Written consent was obtained from the patient after discussion of risks, benefits, and alternatives. Patient has consented to proceed with Colonoscopy with possible intervention   04/17/2023 7:28 AM  [name]  has presented today for surgery, with the diagnosis of Z86.0100 (ICD-10-CM) - History of colon polyps.  The various methods of treatment have been discussed with the patient and family. After consideration of risks, benefits and other options for treatment, the patient has consented to  Procedure(s) with comments: COLONOSCOPY WITH PROPOFOL (N/A) - DM, REQUESTS 1ST as a surgical intervention.  The patient's history has been reviewed, patient examined, no change in status, stable for surgery.  I have reviewed the patient's chart and labs.  Questions were answered to the patient's satisfaction.     Stephanie Freeman

## 2023-04-17 NOTE — Anesthesia Postprocedure Evaluation (Signed)
 Anesthesia Post Note  Patient: Stephanie Freeman  Procedure(s) Performed: COLONOSCOPY WITH PROPOFOL POLYPECTOMY  Patient location during evaluation: Endoscopy Anesthesia Type: General Level of consciousness: awake and alert Pain management: pain level controlled Vital Signs Assessment: post-procedure vital signs reviewed and stable Respiratory status: spontaneous breathing, nonlabored ventilation, respiratory function stable and patient connected to nasal cannula oxygen Cardiovascular status: blood pressure returned to baseline and stable Postop Assessment: no apparent nausea or vomiting Anesthetic complications: no   No notable events documented.   Last Vitals:  Vitals:   04/17/23 0819 04/17/23 0829  BP: (!) 124/55 123/63  Pulse: 74 69  Resp: 14 12  Temp:    SpO2: 97% 100%    Last Pain:  Vitals:   04/17/23 0829  TempSrc:   PainSc: 0-No pain                 Lenard Simmer

## 2023-04-17 NOTE — Transfer of Care (Signed)
 Immediate Anesthesia Transfer of Care Note  Patient: Stephanie Freeman  Procedure(s) Performed: COLONOSCOPY WITH PROPOFOL POLYPECTOMY  Patient Location: PACU  Anesthesia Type:General  Level of Consciousness: drowsy and patient cooperative  Airway & Oxygen Therapy: Patient Spontanous Breathing  Post-op Assessment: Report given to RN and Post -op Vital signs reviewed and stable  Post vital signs: stable  Last Vitals:  Vitals Value Taken Time  BP 111/40 04/17/23 0809  Temp 36.1 C 04/17/23 0809  Pulse 81 04/17/23 0809  Resp 20 04/17/23 0809  SpO2 95 % 04/17/23 0809    Last Pain:  Vitals:   04/17/23 0809  TempSrc: Tympanic  PainSc: Asleep         Complications: No notable events documented.

## 2023-04-18 ENCOUNTER — Encounter: Payer: Self-pay | Admitting: Gastroenterology

## 2023-04-18 LAB — SURGICAL PATHOLOGY

## 2023-12-25 ENCOUNTER — Other Ambulatory Visit: Payer: Self-pay | Admitting: Internal Medicine

## 2023-12-25 DIAGNOSIS — Z1231 Encounter for screening mammogram for malignant neoplasm of breast: Secondary | ICD-10-CM

## 2024-01-26 ENCOUNTER — Ambulatory Visit

## 2024-02-06 ENCOUNTER — Ambulatory Visit
Admission: RE | Admit: 2024-02-06 | Discharge: 2024-02-06 | Disposition: A | Discharge status: Home / Self Care | Source: Ambulatory Visit | Attending: Internal Medicine | Admitting: Internal Medicine

## 2024-02-06 DIAGNOSIS — Z1231 Encounter for screening mammogram for malignant neoplasm of breast: Secondary | ICD-10-CM
